# Patient Record
Sex: Female | Born: 1995 | Race: White | Hispanic: No | Marital: Single | Smoking: Never smoker
Health system: Southern US, Community
[De-identification: ages and names within clinical notes are randomized; demographics above are authoritative.]

## PROBLEM LIST (undated history)

## (undated) DIAGNOSIS — Z789 Other specified health status: Secondary | ICD-10-CM

## (undated) DIAGNOSIS — Z8489 Family history of other specified conditions: Secondary | ICD-10-CM

## (undated) HISTORY — DX: Other specified health status: Z78.9

## (undated) HISTORY — PX: NO PAST SURGERIES: SHX2092

## (undated) HISTORY — DX: Family history of other specified conditions: Z84.89

## (undated) HISTORY — PX: WISDOM TOOTH EXTRACTION: SHX21

---
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
## 2016-06-12 DIAGNOSIS — Z1389 Encounter for screening for other disorder: Secondary | ICD-10-CM | POA: Diagnosis not present

## 2016-06-12 DIAGNOSIS — Z308 Encounter for other contraceptive management: Secondary | ICD-10-CM | POA: Diagnosis not present

## 2016-06-12 DIAGNOSIS — Z68.41 Body mass index (BMI) pediatric, less than 5th percentile for age: Secondary | ICD-10-CM | POA: Diagnosis not present

## 2017-01-18 DIAGNOSIS — R6889 Other general symptoms and signs: Secondary | ICD-10-CM | POA: Diagnosis not present

## 2017-01-18 DIAGNOSIS — Z68.41 Body mass index (BMI) pediatric, less than 5th percentile for age: Secondary | ICD-10-CM | POA: Diagnosis not present

## 2017-01-18 DIAGNOSIS — G43109 Migraine with aura, not intractable, without status migrainosus: Secondary | ICD-10-CM | POA: Diagnosis not present

## 2017-01-18 DIAGNOSIS — Z1389 Encounter for screening for other disorder: Secondary | ICD-10-CM | POA: Diagnosis not present

## 2017-05-10 DIAGNOSIS — Z0001 Encounter for general adult medical examination with abnormal findings: Secondary | ICD-10-CM | POA: Diagnosis not present

## 2017-05-10 DIAGNOSIS — Z68.41 Body mass index (BMI) pediatric, less than 5th percentile for age: Secondary | ICD-10-CM | POA: Diagnosis not present

## 2017-05-10 DIAGNOSIS — Z1389 Encounter for screening for other disorder: Secondary | ICD-10-CM | POA: Diagnosis not present

## 2017-12-13 DIAGNOSIS — Z68.41 Body mass index (BMI) pediatric, less than 5th percentile for age: Secondary | ICD-10-CM | POA: Diagnosis not present

## 2017-12-13 DIAGNOSIS — Z0000 Encounter for general adult medical examination without abnormal findings: Secondary | ICD-10-CM | POA: Diagnosis not present

## 2017-12-13 DIAGNOSIS — N926 Irregular menstruation, unspecified: Secondary | ICD-10-CM | POA: Diagnosis not present

## 2017-12-13 DIAGNOSIS — Z1389 Encounter for screening for other disorder: Secondary | ICD-10-CM | POA: Diagnosis not present

## 2017-12-13 DIAGNOSIS — N946 Dysmenorrhea, unspecified: Secondary | ICD-10-CM | POA: Diagnosis not present

## 2017-12-26 DIAGNOSIS — M25532 Pain in left wrist: Secondary | ICD-10-CM | POA: Diagnosis not present

## 2017-12-26 DIAGNOSIS — M79642 Pain in left hand: Secondary | ICD-10-CM | POA: Diagnosis not present

## 2018-12-09 DIAGNOSIS — Z1389 Encounter for screening for other disorder: Secondary | ICD-10-CM | POA: Diagnosis not present

## 2018-12-09 DIAGNOSIS — Z0001 Encounter for general adult medical examination with abnormal findings: Secondary | ICD-10-CM | POA: Diagnosis not present

## 2018-12-09 DIAGNOSIS — F909 Attention-deficit hyperactivity disorder, unspecified type: Secondary | ICD-10-CM | POA: Diagnosis not present

## 2018-12-09 DIAGNOSIS — R0683 Snoring: Secondary | ICD-10-CM | POA: Diagnosis not present

## 2018-12-09 DIAGNOSIS — Z6841 Body Mass Index (BMI) 40.0 and over, adult: Secondary | ICD-10-CM | POA: Diagnosis not present

## 2019-01-27 ENCOUNTER — Other Ambulatory Visit (HOSPITAL_COMMUNITY)
Admission: RE | Admit: 2019-01-27 | Discharge: 2019-01-27 | Disposition: A | Discharge status: Home / Self Care | Payer: BLUE CROSS/BLUE SHIELD | Source: Ambulatory Visit | Attending: Adult Health | Admitting: Adult Health

## 2019-01-27 ENCOUNTER — Ambulatory Visit: Payer: BLUE CROSS/BLUE SHIELD | Admitting: Adult Health

## 2019-01-27 ENCOUNTER — Encounter: Payer: Self-pay | Admitting: Adult Health

## 2019-01-27 VITALS — BP 139/79 | HR 65 | Ht 60.0 in | Wt 224.5 lb

## 2019-01-27 DIAGNOSIS — Z124 Encounter for screening for malignant neoplasm of cervix: Secondary | ICD-10-CM | POA: Diagnosis not present

## 2019-01-27 DIAGNOSIS — Z01419 Encounter for gynecological examination (general) (routine) without abnormal findings: Secondary | ICD-10-CM | POA: Diagnosis not present

## 2019-01-27 DIAGNOSIS — N946 Dysmenorrhea, unspecified: Secondary | ICD-10-CM | POA: Diagnosis not present

## 2019-01-27 DIAGNOSIS — Z30011 Encounter for initial prescription of contraceptive pills: Secondary | ICD-10-CM | POA: Insufficient documentation

## 2019-01-27 MED ORDER — NORETHINDRONE ACET-ETHINYL EST 1-20 MG-MCG PO TABS: 1.0000 | ORAL_TABLET | Freq: Every day | ORAL | 11 refills | Status: DC

## 2019-01-27 NOTE — Progress Notes (Signed)
Patient ID: Christine Barajas, female   DOB: 1996-01-23, 23 y.o.   MRN: [id number] History of Present Illness:  Christine Barajas is a 23 year old white female, single, G0P0, in with her grandma for her first pap.She had physical with PCP. PCP is Dr Phillips Odor.  Current Medications, Allergies, Past Medical History, Past Surgical History, Family History and Social History were reviewed in [employer] record.     Review of Systems:  Patient denies any headaches, hearing loss, fatigue, blurred vision, shortness of breath, chest pain, abdominal pain, problems with bowel movements, urination, or intercourse. No joint pain or mood swings. +period cramps   Physical Exam:BP 139/79 (BP Location: Left Arm, Patient Position: Sitting, Cuff Size: Large)   Pulse 65   Ht 5' (1.524 m)   Wt 224 lb 8 oz (101.8 kg)   LMP 01/06/2019 (Approximate)   BMI 43.84 kg/m  General:  Well developed, well nourished, no acute distress Skin:  Warm and dry Pelvic:  External genitalia is normal in appearance, no lesions.  The vagina is normal in appearance. Urethra has no lesions or masses. The cervix is nulliparous, pap with GC/CHL and reflex HPV performed  Uterus is felt to be normal size, shape, and contour.  No adnexal masses or tenderness noted.Bladder is non tender, no masses felt. Extremities/musculoskeletal:  No swelling or varicosities noted, no clubbing or cyanosis Psych:  No mood changes, alert and cooperative,seems happy Fall risk is low. PHQ 2 score 0. Examination chaperoned by Francene Finders RN.   Impression: 1. Encounter for gynecological examination with Papanicolaou smear of cervix   2. Encounter for initial prescription of contraceptive pills   3. Menstrual cramps       Plan: Doreatha Martin current pill pack then start junel 1-20,use condoms for 1 pack Meds ordered this encounter  Medications  . norethindrone-ethinyl estradiol (MICROGESTIN,JUNEL,LOESTRIN) 1-20 MG-MCG tablet    Sig: Take 1  tablet by mouth daily.    Dispense:  1 Package    Refill:  11    Order Specific Question:   Supervising Provider    Answer:   Lazaro Arms [[id number]]   Physical with PCP Pap in 3 years if normal  Call prn problems

## 2019-01-28 LAB — CYTOLOGY - PAP
Adequacy: ABSENT
CHLAMYDIA, DNA PROBE: NEGATIVE
DIAGNOSIS: NEGATIVE
NEISSERIA GONORRHEA: NEGATIVE

## 2019-03-28 DIAGNOSIS — H60502 Unspecified acute noninfective otitis externa, left ear: Secondary | ICD-10-CM | POA: Diagnosis not present

## 2019-06-16 DIAGNOSIS — Z681 Body mass index (BMI) 19 or less, adult: Secondary | ICD-10-CM | POA: Diagnosis not present

## 2019-06-16 DIAGNOSIS — E669 Obesity, unspecified: Secondary | ICD-10-CM | POA: Diagnosis not present

## 2019-06-16 DIAGNOSIS — Z1389 Encounter for screening for other disorder: Secondary | ICD-10-CM | POA: Diagnosis not present

## 2019-06-16 DIAGNOSIS — J329 Chronic sinusitis, unspecified: Secondary | ICD-10-CM | POA: Diagnosis not present

## 2019-06-23 ENCOUNTER — Other Ambulatory Visit: Payer: BLUE CROSS/BLUE SHIELD

## 2019-06-23 DIAGNOSIS — Z20822 Contact with and (suspected) exposure to covid-19: Secondary | ICD-10-CM

## 2019-06-23 DIAGNOSIS — R6889 Other general symptoms and signs: Secondary | ICD-10-CM | POA: Diagnosis not present

## 2019-06-27 LAB — NOVEL CORONAVIRUS, NAA: SARS-CoV-2, NAA: NOT DETECTED

## 2020-02-27 DIAGNOSIS — U071 COVID-19: Secondary | ICD-10-CM | POA: Diagnosis not present

## 2020-03-15 ENCOUNTER — Telehealth: Payer: Self-pay | Admitting: Obstetrics & Gynecology

## 2020-03-15 NOTE — Telephone Encounter (Signed)

## 2020-03-17 ENCOUNTER — Other Ambulatory Visit: Payer: Self-pay

## 2020-03-17 ENCOUNTER — Ambulatory Visit (INDEPENDENT_AMBULATORY_CARE_PROVIDER_SITE_OTHER): Payer: BLUE CROSS/BLUE SHIELD

## 2020-03-17 VITALS — BP 116/67 | HR 84 | Ht 60.0 in | Wt 222.0 lb

## 2020-03-17 DIAGNOSIS — Z3201 Encounter for pregnancy test, result positive: Secondary | ICD-10-CM | POA: Diagnosis not present

## 2020-03-17 LAB — POCT URINE PREGNANCY: Preg Test, Ur: POSITIVE — AB

## 2020-03-17 MED ORDER — PROMETHAZINE HCL 25 MG PO TABS: 25.0000 mg | ORAL_TABLET | Freq: Four times a day (QID) | ORAL | 1 refills | Status: DC | PRN

## 2020-03-17 NOTE — Progress Notes (Signed)
   NURSE VISIT- PREGNANCY CONFIRMATION   SUBJECTIVE Christine Barajas is a 24 y.o. G0P0000 female . Here for pregnancy confirmation.  Home pregnancy test + . She reports nausea, also taking prenatal vitamins .    Marland Kitchen    OBJECTIVE:    Appears well, in no apparent distress OB History  Gravida Para Term Preterm AB Living  0 0 0 0 0 0  SAB TAB Ectopic Multiple Live Births  0 0 0 0 0      ASSESSMENT: + pregnancy test    having some nausea PLAN: Schedule for dating ultrasound 1 week  Taking prenatal vitamins   Nausea medicines need    OB packet given: Yes Will route note jennifer griffin  Derl Barrow Xochilt Conant  03/17/2020 2:45 PM

## 2020-03-17 NOTE — Addendum Note (Signed)
Addended by: Cyril Mourning A on: 03/17/2020 05:06 PM   Modules accepted: Orders

## 2020-03-17 NOTE — Progress Notes (Signed)
Chart reviewed for nurse visit. Agree with plan of care. Rx phenergan Adline Potter, NP 03/17/2020 5:05 PM

## 2020-03-18 ENCOUNTER — Telehealth: Payer: Self-pay | Admitting: Obstetrics & Gynecology

## 2020-03-18 NOTE — Telephone Encounter (Signed)
Patient states that she would like a note stating that she has a limit of what she could life up for her Job. Patient also states that she called off of work on  Friday and Sunday due to Morning Sickness and she would like a note to take for work stating that she was off because of her morning sickness. Please contact pt when ready.

## 2020-03-19 ENCOUNTER — Encounter: Payer: Self-pay | Admitting: *Deleted

## 2020-03-19 ENCOUNTER — Other Ambulatory Visit: Payer: Self-pay | Admitting: Obstetrics & Gynecology

## 2020-03-19 DIAGNOSIS — O3680X0 Pregnancy with inconclusive fetal viability, not applicable or unspecified: Secondary | ICD-10-CM

## 2020-03-22 ENCOUNTER — Other Ambulatory Visit: Payer: Self-pay

## 2020-03-22 ENCOUNTER — Ambulatory Visit (INDEPENDENT_AMBULATORY_CARE_PROVIDER_SITE_OTHER): Payer: BLUE CROSS/BLUE SHIELD

## 2020-03-22 ENCOUNTER — Encounter: Payer: Self-pay | Admitting: *Deleted

## 2020-03-22 DIAGNOSIS — O3680X0 Pregnancy with inconclusive fetal viability, not applicable or unspecified: Secondary | ICD-10-CM

## 2020-03-22 DIAGNOSIS — Z3A08 8 weeks gestation of pregnancy: Secondary | ICD-10-CM

## 2020-03-22 NOTE — Progress Notes (Signed)
[place] 8 wks,single IUP with ys,positive fht 162 bpm,normal ovaries,crl 17.87 mm

## 2020-04-16 ENCOUNTER — Other Ambulatory Visit: Payer: Self-pay | Admitting: Obstetrics and Gynecology

## 2020-04-16 DIAGNOSIS — Z3682 Encounter for antenatal screening for nuchal translucency: Secondary | ICD-10-CM

## 2020-04-19 ENCOUNTER — Ambulatory Visit (INDEPENDENT_AMBULATORY_CARE_PROVIDER_SITE_OTHER): Payer: BLUE CROSS/BLUE SHIELD

## 2020-04-19 ENCOUNTER — Encounter: Payer: Self-pay | Admitting: Advanced Practice Midwife

## 2020-04-19 ENCOUNTER — Ambulatory Visit: Payer: BC Managed Care – PPO | Admitting: *Deleted

## 2020-04-19 ENCOUNTER — Ambulatory Visit (INDEPENDENT_AMBULATORY_CARE_PROVIDER_SITE_OTHER): Payer: BC Managed Care – PPO | Admitting: Advanced Practice Midwife

## 2020-04-19 ENCOUNTER — Encounter: Payer: Self-pay | Admitting: Women's Health

## 2020-04-19 VITALS — BP 114/70 | HR 80 | Wt 219.0 lb

## 2020-04-19 DIAGNOSIS — Z3400 Encounter for supervision of normal first pregnancy, unspecified trimester: Secondary | ICD-10-CM | POA: Insufficient documentation

## 2020-04-19 DIAGNOSIS — Z3401 Encounter for supervision of normal first pregnancy, first trimester: Secondary | ICD-10-CM | POA: Diagnosis not present

## 2020-04-19 DIAGNOSIS — Z3A12 12 weeks gestation of pregnancy: Secondary | ICD-10-CM

## 2020-04-19 DIAGNOSIS — Z3682 Encounter for antenatal screening for nuchal translucency: Secondary | ICD-10-CM | POA: Diagnosis not present

## 2020-04-19 DIAGNOSIS — Z3402 Encounter for supervision of normal first pregnancy, second trimester: Secondary | ICD-10-CM

## 2020-04-19 DIAGNOSIS — Z6841 Body Mass Index (BMI) 40.0 and over, adult: Secondary | ICD-10-CM

## 2020-04-19 LAB — POCT URINALYSIS DIPSTICK OB
Blood, UA: NEGATIVE
Glucose, UA: NEGATIVE
Leukocytes, UA: NEGATIVE
Nitrite, UA: NEGATIVE
POC,PROTEIN,UA: NEGATIVE

## 2020-04-19 NOTE — Patient Instructions (Signed)
[name], I greatly value your feedback.  If you receive a survey following your visit with [place] today, we appreciate you taking the time to fill it out.  Thanks, Cathie Beams, DNP, CNM  [facility] HAS MOVED!!! It is now [facility] at [facility] ([address] [place], [place] [address]) Entrance located off of [address] Free 24/7 valet parking   Nausea & Vomiting  Have saltine crackers or pretzels by your bed and eat a few bites before you raise your head out of bed in the morning  Eat small frequent meals throughout the day instead of large meals  Drink plenty of fluids throughout the day to stay hydrated, just don't drink a lot of fluids with your meals.  This can make your stomach fill up faster making you feel sick  Do not brush your teeth right after you eat  Products with real ginger are good for nausea, like ginger ale and ginger hard candy Make sure it says made with real ginger!  Sucking on sour candy like lemon heads is also good for nausea  If your prenatal vitamins make you nauseated, take them at night so you will sleep through the nausea  Sea Bands  If you feel like you need medicine for the nausea & vomiting please let us know  If you are unable to keep any fluids or food down please let us know   Constipation  Drink plenty of fluid, preferably water, throughout the day  Eat foods high in fiber such as fruits, vegetables, and grains  Exercise, such as walking, is a good way to keep your bowels regular  Drink warm fluids, especially warm prune juice, or decaf coffee  Eat a 1/2 cup of real oatmeal (not instant), 1/2 cup applesauce, and 1/2-1 cup warm prune juice every day  If needed, you may take Colace (docusate sodium) stool softener once or twice a day to help keep the stool soft.   If you still are having problems with constipation, you may take Miralax once daily as needed to help keep your bowels regular.    Home Blood Pressure Monitoring for Patients   Your provider has recommended that you check your blood pressure (BP) at least once a week at home. If you do not have a blood pressure cuff at home, one will be provided for you. Contact your provider if you have not received your monitor within 1 week.   Helpful Tips for Accurate Home Blood Pressure Checks  . Don't smoke, exercise, or drink caffeine 30 minutes before checking your BP . Use the restroom before checking your BP (a full bladder can raise your pressure) . Relax in a comfortable upright chair . Feet on the ground . Left arm resting comfortably on a flat surface at the level of your heart . Legs uncrossed . Back supported . Sit quietly and don't talk . Place the cuff on your bare arm . Adjust snuggly, so that only two fingertips can fit between your skin and the top of the cuff . Check 2 readings separated by at least one minute . Keep a log of your BP readings . For a visual, please reference this diagram: http://ccnc.care/bpdiagram  Provider Name: [facility]     Phone: [phone]  Zone 1: ALL CLEAR  Continue to monitor your symptoms:  . BP reading is less than 140 (top number) or less than 90 (bottom number)  . No right upper stomach  pain . No headaches or seeing spots . No feeling nauseated or throwing up . No swelling in face and hands  Zone 2: CAUTION Call your doctor's office for any of the following:  . BP reading is greater than 140 (top number) or greater than 90 (bottom number)  . Stomach pain under your ribs in the middle or right side . Headaches or seeing spots . Feeling nauseated or throwing up . Swelling in face and hands  Zone 3: EMERGENCY  Seek immediate medical care if you have any of the following:  . BP reading is greater than160 (top number) or greater than 110 (bottom number) . Severe headaches not improving with Tylenol . Serious difficulty catching your breath . Any worsening  symptoms from Zone 2    First Trimester of Pregnancy The first trimester of pregnancy is from week 1 until the end of week 12 (months 1 through 3). A week after a sperm fertilizes an egg, the egg will implant on the wall of the uterus. This embryo will begin to develop into a baby. Genes from you and your partner are forming the baby. The female genes determine whether the baby is a boy or a girl. At 6-8 weeks, the eyes and face are formed, and the heartbeat can be seen on ultrasound. At the end of 12 weeks, all the baby's organs are formed.  Now that you are pregnant, you will want to do everything you can to have a healthy baby. Two of the most important things are to get good prenatal care and to follow your health care provider's instructions. Prenatal care is all the medical care you receive before the baby's birth. This care will help prevent, find, and treat any problems during the pregnancy and childbirth. BODY CHANGES Your body goes through many changes during pregnancy. The changes vary from woman to woman.   You may gain or lose a couple of pounds at first.  You may feel sick to your stomach (nauseous) and throw up (vomit). If the vomiting is uncontrollable, call your health care provider.  You may tire easily.  You may develop headaches that can be relieved by medicines approved by your health care provider.  You may urinate more often. Painful urination may mean you have a bladder infection.  You may develop heartburn as a result of your pregnancy.  You may develop constipation because certain hormones are causing the muscles that push waste through your intestines to slow down.  You may develop hemorrhoids or swollen, bulging veins (varicose veins).  Your breasts may begin to grow larger and become tender. Your nipples may stick out more, and the tissue that surrounds them (areola) may become darker.  Your gums may bleed and may be sensitive to brushing and flossing.  Dark  spots or blotches (chloasma, mask of pregnancy) may develop on your face. This will likely fade after the baby is born.  Your menstrual periods will stop.  You may have a loss of appetite.  You may develop cravings for certain kinds of food.  You may have changes in your emotions from day to day, such as being excited to be pregnant or being concerned that something may go wrong with the pregnancy and baby.  You may have more vivid and strange dreams.  You may have changes in your hair. These can include thickening of your hair, rapid growth, and changes in texture. Some women also have hair loss during or after pregnancy, or hair that  feels dry or thin. Your hair will most likely return to normal after your baby is born. WHAT TO EXPECT AT YOUR PRENATAL VISITS During a routine prenatal visit:  You will be weighed to make sure you and the baby are growing normally.  Your blood pressure will be taken.  Your abdomen will be measured to track your baby's growth.  The fetal heartbeat will be listened to starting around week 10 or 12 of your pregnancy.  Test results from any previous visits will be discussed. Your health care provider may ask you:  How you are feeling.  If you are feeling the baby move.  If you have had any abnormal symptoms, such as leaking fluid, bleeding, severe headaches, or abdominal cramping.  If you have any questions. Other tests that may be performed during your first trimester include:  Blood tests to find your blood type and to check for the presence of any previous infections. They will also be used to check for low iron levels (anemia) and Rh antibodies. Later in the pregnancy, blood tests for diabetes will be done along with other tests if problems develop.  Urine tests to check for infections, diabetes, or protein in the urine.  An ultrasound to confirm the proper growth and development of the baby.  An amniocentesis to check for possible genetic  problems.  Fetal screens for spina bifida and Down syndrome.  You may need other tests to make sure you and the baby are doing well. HOME CARE INSTRUCTIONS  Medicines  Follow your health care provider's instructions regarding medicine use. Specific medicines may be either safe or unsafe to take during pregnancy.  Take your prenatal vitamins as directed.  If you develop constipation, try taking a stool softener if your health care provider approves. Diet  Eat regular, well-balanced meals. Choose a variety of foods, such as meat or vegetable-based protein, fish, milk and low-fat dairy products, vegetables, fruits, and whole grain breads and cereals. Your health care provider will help you determine the amount of weight gain that is right for you.  Avoid raw meat and uncooked cheese. These carry germs that can cause birth defects in the baby.  Eating four or five small meals rather than three large meals a day may help relieve nausea and vomiting. If you start to feel nauseous, eating a few soda crackers can be helpful. Drinking liquids between meals instead of during meals also seems to help nausea and vomiting.  If you develop constipation, eat more high-fiber foods, such as fresh vegetables or fruit and whole grains. Drink enough fluids to keep your urine clear or pale yellow. Activity and Exercise  Exercise only as directed by your health care provider. Exercising will help you:  Control your weight.  Stay in shape.  Be prepared for labor and delivery.  Experiencing pain or cramping in the lower abdomen or low back is a good sign that you should stop exercising. Check with your health care provider before continuing normal exercises.  Try to avoid standing for long periods of time. Move your legs often if you must stand in one place for a long time.  Avoid heavy lifting.  Wear low-heeled shoes, and practice good posture.  You may continue to have sex unless your health care  provider directs you otherwise. Relief of Pain or Discomfort  Wear a good support bra for breast tenderness.    Take warm sitz baths to soothe any pain or discomfort caused by hemorrhoids. Use hemorrhoid cream  if your health care provider approves.    Rest with your legs elevated if you have leg cramps or low back pain.  If you develop varicose veins in your legs, wear support hose. Elevate your feet for 15 minutes, 3-4 times a day. Limit salt in your diet. Prenatal Care  Schedule your prenatal visits by the twelfth week of pregnancy. They are usually scheduled monthly at first, then more often in the last 2 months before delivery.  Write down your questions. Take them to your prenatal visits.  Keep all your prenatal visits as directed by your health care provider. Safety  Wear your seat belt at all times when driving.  Make a list of emergency phone numbers, including numbers for family, friends, the hospital, and police and fire departments. General Tips  Ask your health care provider for a referral to a local prenatal education class. Begin classes no later than at the beginning of month 6 of your pregnancy.  Ask for help if you have counseling or nutritional needs during pregnancy. Your health care provider can offer advice or refer you to specialists for help with various needs.  Do not use hot tubs, steam rooms, or saunas.  Do not douche or use tampons or scented sanitary pads.  Do not cross your legs for long periods of time.  Avoid cat litter boxes and soil used by cats. These carry germs that can cause birth defects in the baby and possibly loss of the fetus by miscarriage or stillbirth.  Avoid all smoking, herbs, alcohol, and medicines not prescribed by your health care provider. Chemicals in these affect the formation and growth of the baby.  Schedule a dentist appointment. At home, brush your teeth with a soft toothbrush and be gentle when you floss. SEEK MEDICAL  CARE IF:   You have dizziness.  You have mild pelvic cramps, pelvic pressure, or nagging pain in the abdominal area.  You have persistent nausea, vomiting, or diarrhea.  You have a bad smelling vaginal discharge.  You have pain with urination.  You notice increased swelling in your face, hands, legs, or ankles. SEEK IMMEDIATE MEDICAL CARE IF:   You have a fever.  You are leaking fluid from your vagina.  You have spotting or bleeding from your vagina.  You have severe abdominal cramping or pain.  You have rapid weight gain or loss.  You vomit blood or material that looks like coffee grounds.  You are exposed to [place] measles and have never had them.  You are exposed to fifth disease or chickenpox.  You develop a severe headache.  You have shortness of breath.  You have any kind of trauma, such as from a fall or a car accident. Document Released: 11/14/2001 Document Revised: 04/06/2014 Document Reviewed: 09/30/2013 [facility] Patient Information 2015 [place], [place]. This information is not intended to replace advice given to you by your health care provider. Make sure you discuss any questions you have with your health care provider.  Coronavirus (COVID-19) Are you at risk?  Are you at risk for the Coronavirus (COVID-19)?  To be considered HIGH RISK for Coronavirus (COVID-19), you have to meet the following criteria:  . Traveled to [place], [place], [place], [place] or [place]; or in the [place] to [place], [place], [place], or [place]; and have fever, cough, and shortness of breath within the last 2 weeks of travel OR . Been in close contact with a person diagnosed with COVID-19 within the last 2  weeks and have fever, cough, and shortness of breath . IF YOU DO NOT MEET THESE CRITERIA, YOU ARE CONSIDERED LOW RISK FOR COVID-19.  What to do if you are HIGH RISK for COVID-19?  Marland Kitchen If you are having a medical emergency, call 911. . Seek medical care right away.  Before you go to a doctor's office, urgent care or emergency department, call ahead and tell them about your recent travel, contact with someone diagnosed with COVID-19, and your symptoms. You should receive instructions from your physician's office regarding next steps of care.  . When you arrive at healthcare provider, tell the healthcare staff immediately you have returned from visiting [place], [place], [place], [place] or [place]; or traveled in the [place] to [place], [place], [place], or [place]; in the last two weeks or you have been in close contact with a person diagnosed with COVID-19 in the last 2 weeks.   . Tell the health care staff about your symptoms: fever, cough and shortness of breath. . After you have been seen by a medical provider, you will be either: o Tested for (COVID-19) and discharged home on quarantine except to seek medical care if symptoms worsen, and asked to  - Stay home and avoid contact with others until you get your results (4-5 days)  - Avoid travel on public transportation if possible (such as bus, train, or airplane) or o Sent to the Emergency Department by EMS for evaluation, COVID-19 testing, and possible admission depending on your condition and test results.  What to do if you are LOW RISK for COVID-19?  Reduce your risk of any infection by using the same precautions used for avoiding the common cold or flu:  Marland Kitchen Wash your hands often with soap and warm water for at least 20 seconds.  If soap and water are not readily available, use an alcohol-based hand sanitizer with at least 60% alcohol.  . If coughing or sneezing, cover your mouth and nose by coughing or sneezing into the elbow areas of your shirt or coat, into a tissue or into your sleeve (not your hands). . Avoid shaking hands with others and consider head nods or verbal greetings only. . Avoid touching your eyes, nose, or mouth with unwashed hands.  . Avoid close contact with people who are  sick. . Avoid places or events with large numbers of people in one location, like concerts or sporting events. . Carefully consider travel plans you have or are making. . If you are planning any travel outside or inside the [place], visit the CDC's Travelers' Health webpage for the latest health notices. . If you have some symptoms but not all symptoms, continue to monitor at home and seek medical attention if your symptoms worsen. . If you are having a medical emergency, call 911.   [place] / e-Visit: [email or web address]         [facility]: [facility]: [phone]                   [facility]: [phone]     Safe Medications in Pregnancy   Acne: Benzoyl Peroxide Salicylic Acid  Backache/Headache: Tylenol: 2 regular strength every 4 hours OR              2 Extra strength every 6 hours  Colds/Coughs/Allergies: Benadryl (alcohol free) 25 mg every 6 hours as needed Breath right strips Claritin  Cepacol throat lozenges Chloraseptic throat spray Cold-Eeze- up to three times per day Cough drops, alcohol free Flonase (by prescription only) Guaifenesin Mucinex Robitussin DM (plain only, alcohol free) Saline nasal spray/drops Sudafed (pseudoephedrine) & Actifed ** use only after [redacted] weeks gestation and if you do not have high blood pressure Tylenol Vicks Vaporub Zinc lozenges Zyrtec   Constipation: Colace Ducolax suppositories Fleet enema Glycerin suppositories Metamucil Milk of magnesia Miralax Senokot Smooth move tea  Diarrhea: Kaopectate Imodium A-D  *NO pepto Bismol  Hemorrhoids: Anusol Anusol HC Preparation H Tucks  Indigestion: Tums Maalox Mylanta Zantac  Pepcid  Insomnia: Benadryl (alcohol free) 69m every 6 hours as needed Tylenol PM Unisom, no Gelcaps  Leg  Cramps: Tums MagGel  Nausea/Vomiting:  Bonine Dramamine Emetrol Ginger extract Sea bands Meclizine  Nausea medication to take during pregnancy:  Unisom (doxylamine succinate 25 mg tablets) Take one tablet daily at bedtime. If symptoms are not adequately controlled, the dose can be increased to a maximum recommended dose of two tablets daily (1/2 tablet in the morning, 1/2 tablet mid-afternoon and one at bedtime). Vitamin B6 1035mtablets. Take one tablet twice a day (up to 200 mg per day).  Skin Rashes: Aveeno products Benadryl cream or 2582mvery 6 hours as needed Calamine Lotion 1% cortisone cream  Yeast infection: Gyne-lotrimin 7 Monistat 7   **If taking multiple medications, please check labels to avoid duplicating the same active ingredients **take medication as directed on the label ** Do not exceed 4000 mg of tylenol in 24 hours **Do not take medications that contain aspirin or ibuprofen

## 2020-04-19 NOTE — Progress Notes (Signed)
[place] 12 wks,measurements c/w dates,CRL 66.51 mm,NB present,NT 1.4 mm,normal ovaries,fhr 147 bpm

## 2020-04-19 NOTE — Progress Notes (Signed)
INITIAL OBSTETRICAL VISIT Patient name: Christine Barajas MRN [id number]  Date of birth: July 31, 1996 Chief Complaint:   Initial Prenatal Visit (nt/it)  History of Present Illness:   Christine Barajas is a 24 y.o. G12P0000 Caucasian female at [redacted]w[redacted]d by LMP/8 week [place] with an Estimated Date of Delivery: 11/01/20 being seen today for her initial obstetrical visit.   Her obstetrical history is significant for first baby.   Today she reports no complaints  Nausea is improving. .  Patient's last menstrual period was 01/26/2020. Last pap 01/27/19. Results were: normal Review of Systems:   Pertinent items are noted in HPI Denies cramping/contractions, leakage of fluid, vaginal bleeding, abnormal vaginal discharge w/ itching/odor/irritation, headaches, visual changes, shortness of breath, chest pain, abdominal pain, severe nausea/vomiting, or problems with urination or bowel movements unless otherwise stated above.  Pertinent History Reviewed:  Reviewed past medical,surgical, social, obstetrical and family history.  Reviewed problem list, medications and allergies. OB History  Gravida Para Term Preterm AB Living  1 0 0 0 0 0  SAB TAB Ectopic Multiple Live Births  0 0 0 0 0    # Outcome Date GA Lbr Len/2nd Weight Sex Delivery Anes PTL Lv  1 Current            Physical Assessment:   Vitals:   04/19/20 1524  BP: 114/70  Pulse: 80  Weight: 219 lb (99.3 kg)  Body mass index is 42.77 kg/m.       Physical Examination:  General appearance - well appearing, and in no distress  Mental status - alert, oriented to person, place, and time  Psych:  She has a normal mood and affect  Skin - warm and dry, normal color, no suspicious lesions noted  Chest - effort normal, all lung fields clear to auscultation bilaterally  Heart - normal rate and regular rhythm  Abdomen - soft, nontender  Extremities:  No swelling or varicosities noted   Fetal Heart Rate (bpm): [place] via [place] 12 wks,measurements c/w dates,CRL  66.51 mm,NB present,NT 1.4 mm,normal ovaries,fhr 147 bpm  No results found for this or any previous visit (from the past 24 hour(s)).  Assessment & Plan:  1) Low-Risk Pregnancy G1P0000 at [redacted]w[redacted]d with an Estimated Date of Delivery: 11/01/20   2) Initial OB visit  3) Covid 2 months ago--hold off on vaccine. If she wants  Vaccine later, check antibodies first.   4)  [employer] is offering her 60% pay since she can't lift/push/pull >20 lbs. Will let us know if she decides to take it.   Meds: No orders of the defined types were placed in this encounter.   Initial labs obtained Continue prenatal vitamins Reviewed n/v relief measures and warning s/s to report Reviewed recommended weight gain based on pre-gravid BMI Encouraged well-balanced diet Watched video for carrier screening/genetic testing:  Genetic Screening discussed First Screen and Integrated Screen: requested Cystic fibrosis screening requested SMA screening requested Fragile X screening requested Ultrasound discussed; fetal survey: requested CCNC completed  Follow-up: Return in about 3 weeks (around 05/10/2020) for OB Mychart visit.   Orders Placed This Encounter  Procedures  . Urine Culture  . GC/Chlamydia Probe Amp  . Integrated 1  . Genetic Screening  . Obstetric Panel, Including HIV  . Hepatitis C antibody  . Hemoglobin A1c  . POC Urinalysis Dipstick OB    Jacklyn Shell DNP, CNM 04/19/2020 4:21 PM

## 2020-04-20 LAB — GC/CHLAMYDIA PROBE AMP
Chlamydia trachomatis, NAA: NEGATIVE
Neisseria Gonorrhoeae by PCR: NEGATIVE

## 2020-04-21 LAB — HEMOGLOBIN A1C
Est. average glucose Bld gHb Est-mCnc: 100 mg/dL
Hgb A1c MFr Bld: 5.1 % (ref 4.8–5.6)

## 2020-04-21 LAB — OBSTETRIC PANEL, INCLUDING HIV
Antibody Screen: NEGATIVE
Basophils Absolute: 0 10*3/uL (ref 0.0–0.2)
Basos: 0 %
EOS (ABSOLUTE): 0.2 10*3/uL (ref 0.0–0.4)
Eos: 1 %
HIV Screen 4th Generation wRfx: NONREACTIVE
Hematocrit: 38.8 % (ref 34.0–46.6)
Hemoglobin: 13.5 g/dL (ref 11.1–15.9)
Hepatitis B Surface Ag: NEGATIVE
Immature Grans (Abs): 0 10*3/uL (ref 0.0–0.1)
Immature Granulocytes: 0 %
Lymphocytes Absolute: 2.4 10*3/uL (ref 0.7–3.1)
Lymphs: 22 %
MCH: 29.5 pg (ref 26.6–33.0)
MCHC: 34.8 g/dL (ref 31.5–35.7)
MCV: 85 fL (ref 79–97)
Monocytes Absolute: 0.5 10*3/uL (ref 0.1–0.9)
Monocytes: 5 %
Neutrophils Absolute: 7.6 10*3/uL — ABNORMAL HIGH (ref 1.4–7.0)
Neutrophils: 72 %
Platelets: 183 10*3/uL (ref 150–450)
RBC: 4.57 x10E6/uL (ref 3.77–5.28)
RDW: 13.1 % (ref 11.7–15.4)
RPR Ser Ql: NONREACTIVE
Rh Factor: POSITIVE
Rubella Antibodies, IGG: 1.92 index (ref >0.99)
WBC: 10.7 10*3/uL (ref 3.4–10.8)

## 2020-04-21 LAB — HEPATITIS C ANTIBODY: Hep C Virus Ab: <0.1 s/co ratio (ref 0.0–0.9)

## 2020-04-21 LAB — URINE CULTURE: Organism ID, Bacteria: NO GROWTH

## 2020-04-21 LAB — INTEGRATED 1
Crown Rump Length: 66.5 mm
Gest. Age on Collection Date: 12.7 weeks
Maternal Age at EDD: 23.8 yr
Nuchal Translucency (NT): 1.4 mm
Number of Fetuses: 1
PAPP-A Value: 833.8 ng/mL
Weight: 219 [lb_av]

## 2020-05-10 ENCOUNTER — Telehealth (INDEPENDENT_AMBULATORY_CARE_PROVIDER_SITE_OTHER): Payer: BLUE CROSS/BLUE SHIELD | Admitting: Women's Health

## 2020-05-10 ENCOUNTER — Encounter: Payer: Self-pay | Admitting: Women's Health

## 2020-05-10 VITALS — BP 126/76 | HR 90

## 2020-05-10 DIAGNOSIS — Z3A15 15 weeks gestation of pregnancy: Secondary | ICD-10-CM

## 2020-05-10 DIAGNOSIS — Z363 Encounter for antenatal screening for malformations: Secondary | ICD-10-CM

## 2020-05-10 DIAGNOSIS — Z3402 Encounter for supervision of normal first pregnancy, second trimester: Secondary | ICD-10-CM

## 2020-05-10 NOTE — Progress Notes (Signed)
TELEHEALTH VIRTUAL OBSTETRICS VISIT ENCOUNTER NOTE Patient name: Christine Barajas MRN [id number]  Date of birth: 20-Jan-1996  I connected with patient on 05/10/20 at  4:10 PM EDT by MyChart video and verified that I am speaking with the correct person using two identifiers. Pt is not currently in our office, she is at home.  The provider is in the office.    I discussed the limitations, risks, security and privacy concerns of performing an evaluation and management service by telephone and the availability of in person appointments. I also discussed with the patient that there may be a patient responsible charge related to this service. The patient expressed understanding and agreed to proceed.  Chief Complaint:   Routine Prenatal Visit  History of Present Illness:   Christine Barajas is a 24 y.o. G1P0000 female at [redacted]w[redacted]d with an Estimated Date of Delivery: 11/01/20 being evaluated today for ongoing management of a low-risk pregnancy.  Depression screen [facility] 2/9 04/19/2020 01/27/2019  Decreased Interest 1 0  Down, Depressed, Hopeless 1 0  PHQ - 2 Score 2 0  Altered sleeping 1 -  Tired, decreased energy 1 -  Change in appetite 1 -  Feeling bad or failure about yourself  0 -  Trouble concentrating 0 -  Moving slowly or fidgety/restless 1 -  Suicidal thoughts 0 -  PHQ-9 Score 6 -  Difficult doing work/chores Somewhat difficult -    Today she reports headache that lasted 4 days. Does have h/o migraines dx at 24yo. Contractions: Not present. Vag. Bleeding: None.   . denies leaking of fluid. Review of Systems:   Pertinent items are noted in HPI Denies abnormal vaginal discharge w/ itching/odor/irritation, headaches, visual changes, shortness of breath, chest pain, abdominal pain, severe nausea/vomiting, or problems with urination or bowel movements unless otherwise stated above. Pertinent History Reviewed:  Reviewed past medical,surgical, social, obstetrical and family history.  Reviewed  problem list, medications and allergies. Physical Assessment:   Vitals:   05/10/20 1613  BP: 126/76  Pulse: 90  There is no height or weight on file to calculate BMI.        Physical Examination:   General:  Alert, oriented and cooperative.   Mental Status: Normal mood and affect perceived. Normal judgment and thought content.  Rest of physical exam deferred due to type of encounter  No results found for this or any previous visit (from the past 24 hour(s)).  Assessment & Plan:  1) Pregnancy G1P0000 at [redacted]w[redacted]d with an Estimated Date of Delivery: 11/01/20   2) Headaches, h/o migraines, gave printed prevention/relief measures    Meds: No orders of the defined types were placed in this encounter.   Labs/procedures today: none  Plan:  Continue routine obstetrical care.  Has home bp cuff.  Check bp weekly, let us know if >140/90.  Next visit: prefers will be in person for anatomy u/s, 2nd IT    Reviewed: Preterm labor symptoms and general obstetric precautions including but not limited to vaginal bleeding, contractions, leaking of fluid and fetal movement were reviewed in detail with the patient. The patient was advised to call back or seek an in-person office evaluation/go to [facility]U at [facility] for any urgent or concerning symptoms. All questions were answered. Please refer to After Visit Summary for other counseling recommendations.    I provided 15 minutes of non-face-to-face time during this encounter.  Follow-up: Return in about 3 weeks (around 05/31/2020) for LROB, [id number], 2nd IT, in person,  CNM.  Orders Placed This Encounter  Procedures  . US OB Comp + 14 Wk   Conception Doebler R Sandra Tellefsen CNM, [facility] 05/10/2020 4:29 PM

## 2020-05-10 NOTE — Patient Instructions (Addendum)
[name], I greatly value your feedback.  If you receive a survey following your visit with [place] today, we appreciate you taking the time to fill it out.  Thanks, Joellyn Haff, CNM, WHNP-BC  [facility] ([address] [place], [place] [address]) Entrance C, located off of [address] valet parking  Go to [email or web address] to register for FREE online childbirth classes  For Headaches:   Stay well hydrated, drink enough water so that your urine is clear, sometimes if you are dehydrated you can get headaches  Eat small frequent meals and snacks, sometimes if you are hungry you can get headaches  Sometimes you get headaches during pregnancy from the pregnancy hormones  You can try tylenol (1-2 regular strength 325mg  or 1-2 extra strength 500mg ) as directed on the box. The least amount of medication that works is best.   Cool compresses (cool wet washcloth or ice pack) to area of head that is hurting  You can also try drinking a caffeinated drink to see if this will help  If not helping, try below:  For Prevention of Headaches/Migraines:  CoQ10 100mg  three times daily  Vitamin B2 400mg  daily  Magnesium Oxide 400-600mg  daily  If You Get a Bad Headache/Migraine:  Benadryl 25mg    Magnesium Oxide  1 large Gatorade  2 extra strength Tylenol (1,000mg  total)  1 cup coffee or Coke  If this doesn't help please call @ [phone]    [facility] Pediatricians/Family Doctors:  Pediatrics [phone]            [facility] [phone]                 [facility] [phone] (usually not accepting new patients unless you have family there already, you are always welcome to call and ask)       [facility] Department [phone]       [facility] Pediatricians/Family Doctors:   [facility]: [phone]  [facility]: [phone]  [facility]: [phone]  [facility] Doctors:   [facility]: [phone]   [phone] Family Medicine: [phone]  [facility] Doctors:  [facility]: [phone]    Home Blood Pressure Monitoring for Patients   Your provider has recommended that you check your blood pressure (BP) at least once a week at home. If you do not have a blood pressure cuff at home, one will be provided for you. Contact your provider if you have not received your monitor within 1 week.   Helpful Tips for Accurate Home Blood Pressure Checks  . Don't smoke, exercise, or drink caffeine 30 minutes before checking your BP . Use the restroom before checking your BP (a full bladder can raise your pressure) . Relax in a comfortable upright chair . Feet on the ground . Left arm resting comfortably on a flat surface at the level of your heart . Legs uncrossed . Back supported . Sit quietly and don't talk . Place the cuff on your bare arm . Adjust snuggly, so that only two fingertips can fit between your skin and the top of the cuff . Check 2 readings separated by at least one minute . Keep a log of your BP readings . For a visual, please reference this diagram: http://ccnc.care/bpdiagram  Provider Name: [facility]     Phone: [phone]  Zone 1: ALL CLEAR  Continue to monitor your symptoms:  . BP  reading is less than 140 (top number) or less than 90 (bottom number)  . No right upper stomach pain . No headaches or seeing spots . No feeling nauseated or throwing up . No swelling in face and hands  Zone 2: CAUTION Call your doctor's office for any of the following:  . BP reading is greater than 140 (top number) or greater than 90 (bottom number)  . Stomach pain under your ribs in the middle or right side . Headaches or seeing spots . Feeling nauseated or throwing up . Swelling in face and hands  Zone 3: EMERGENCY  Seek immediate medical care if you  have any of the following:  . BP reading is greater than160 (top number) or greater than 110 (bottom number) . Severe headaches not improving with Tylenol . Serious difficulty catching your breath . Any worsening symptoms from Zone 2     Second Trimester of Pregnancy The second trimester is from week 14 through week 27 (months 4 through 6). The second trimester is often a time when you feel your best. Your body has adjusted to being pregnant, and you begin to feel better physically. Usually, morning sickness has lessened or quit completely, you may have more energy, and you may have an increase in appetite. The second trimester is also a time when the fetus is growing rapidly. At the end of the sixth month, the fetus is about 9 inches long and weighs about 1 pounds. You will likely begin to feel the baby move (quickening) between 16 and 20 weeks of pregnancy. Body changes during your second trimester Your body continues to go through many changes during your second trimester. The changes vary from woman to woman.  Your weight will continue to increase. You will notice your lower abdomen bulging out.  You may begin to get stretch marks on your hips, abdomen, and breasts.  You may develop headaches that can be relieved by medicines. The medicines should be approved by your health care provider.  You may urinate more often because the fetus is pressing on your bladder.  You may develop or continue to have heartburn as a result of your pregnancy.  You may develop constipation because certain hormones are causing the muscles that push waste through your intestines to slow down.  You may develop hemorrhoids or swollen, bulging veins (varicose veins).  You may have back pain. This is caused by: ? Weight gain. ? Pregnancy hormones that are relaxing the joints in your pelvis. ? A shift in weight and the muscles that support your balance.  Your breasts will continue to grow and they will  continue to become tender.  Your gums may bleed and may be sensitive to brushing and flossing.  Dark spots or blotches (chloasma, mask of pregnancy) may develop on your face. This will likely fade after the baby is born.  A dark line from your belly button to the pubic area (linea nigra) may appear. This will likely fade after the baby is born.  You may have changes in your hair. These can include thickening of your hair, rapid growth, and changes in texture. Some women also have hair loss during or after pregnancy, or hair that feels dry or thin. Your hair will most likely return to normal after your baby is born.  What to expect at prenatal visits During a routine prenatal visit:  You will be weighed to make sure you and the fetus are growing normally.  Your blood pressure will  be taken.  Your abdomen will be measured to track your baby's growth.  The fetal heartbeat will be listened to.  Any test results from the previous visit will be discussed.  Your health care provider may ask you:  How you are feeling.  If you are feeling the baby move.  If you have had any abnormal symptoms, such as leaking fluid, bleeding, severe headaches, or abdominal cramping.  If you are using any tobacco products, including cigarettes, chewing tobacco, and electronic cigarettes.  If you have any questions.  Other tests that may be performed during your second trimester include:  Blood tests that check for: ? Low iron levels (anemia). ? High blood sugar that affects pregnant women (gestational diabetes) between 30 and 28 weeks. ? Rh antibodies. This is to check for a protein on red blood cells (Rh factor).  Urine tests to check for infections, diabetes, or protein in the urine.  An ultrasound to confirm the proper growth and development of the baby.  An amniocentesis to check for possible genetic problems.  Fetal screens for spina bifida and Down syndrome.  HIV (human immunodeficiency  virus) testing. Routine prenatal testing includes screening for HIV, unless you choose not to have this test.  Follow these instructions at home: Medicines  Follow your health care provider's instructions regarding medicine use. Specific medicines may be either safe or unsafe to take during pregnancy.  Take a prenatal vitamin that contains at least 600 micrograms (mcg) of folic acid.  If you develop constipation, try taking a stool softener if your health care provider approves. Eating and drinking  Eat a balanced diet that includes fresh fruits and vegetables, whole grains, good sources of protein such as meat, eggs, or tofu, and low-fat dairy. Your health care provider will help you determine the amount of weight gain that is right for you.  Avoid raw meat and uncooked cheese. These carry germs that can cause birth defects in the baby.  If you have low calcium intake from food, talk to your health care provider about whether you should take a daily calcium supplement.  Limit foods that are high in fat and processed sugars, such as fried and sweet foods.  To prevent constipation: ? Drink enough fluid to keep your urine clear or pale yellow. ? Eat foods that are high in fiber, such as fresh fruits and vegetables, whole grains, and beans. Activity  Exercise only as directed by your health care provider. Most women can continue their usual exercise routine during pregnancy. Try to exercise for 30 minutes at least 5 days a week. Stop exercising if you experience uterine contractions.  Avoid heavy lifting, wear low heel shoes, and practice good posture.  A sexual relationship may be continued unless your health care provider directs you otherwise. Relieving pain and discomfort  Wear a good support bra to prevent discomfort from breast tenderness.  Take warm sitz baths to soothe any pain or discomfort caused by hemorrhoids. Use hemorrhoid cream if your health care provider  approves.  Rest with your legs elevated if you have leg cramps or low back pain.  If you develop varicose veins, wear support hose. Elevate your feet for 15 minutes, 3-4 times a day. Limit salt in your diet. Prenatal Care  Write down your questions. Take them to your prenatal visits.  Keep all your prenatal visits as told by your health care provider. This is important. Safety  Wear your seat belt at all times when driving.  Make  a list of emergency phone numbers, including numbers for family, friends, the hospital, and police and fire departments. General instructions  Ask your health care provider for a referral to a local prenatal education class. Begin classes no later than the beginning of month 6 of your pregnancy.  Ask for help if you have counseling or nutritional needs during pregnancy. Your health care provider can offer advice or refer you to specialists for help with various needs.  Do not use hot tubs, steam rooms, or saunas.  Do not douche or use tampons or scented sanitary pads.  Do not cross your legs for long periods of time.  Avoid cat litter boxes and soil used by cats. These carry germs that can cause birth defects in the baby and possibly loss of the fetus by miscarriage or stillbirth.  Avoid all smoking, herbs, alcohol, and unprescribed drugs. Chemicals in these products can affect the formation and growth of the baby.  Do not use any products that contain nicotine or tobacco, such as cigarettes and e-cigarettes. If you need help quitting, ask your health care provider.  Visit your dentist if you have not gone yet during your pregnancy. Use a soft toothbrush to brush your teeth and be gentle when you floss. Contact a health care provider if:  You have dizziness.  You have mild pelvic cramps, pelvic pressure, or nagging pain in the abdominal area.  You have persistent nausea, vomiting, or diarrhea.  You have a bad smelling vaginal discharge.  You have  pain when you urinate. Get help right away if:  You have a fever.  You are leaking fluid from your vagina.  You have spotting or bleeding from your vagina.  You have severe abdominal cramping or pain.  You have rapid weight gain or weight loss.  You have shortness of breath with chest pain.  You notice sudden or extreme swelling of your face, hands, ankles, feet, or legs.  You have not felt your baby move in over an hour.  You have severe headaches that do not go away when you take medicine.  You have vision changes. Summary  The second trimester is from week 14 through week 27 (months 4 through 6). It is also a time when the fetus is growing rapidly.  Your body goes through many changes during pregnancy. The changes vary from woman to woman.  Avoid all smoking, herbs, alcohol, and unprescribed drugs. These chemicals affect the formation and growth your baby.  Do not use any tobacco products, such as cigarettes, chewing tobacco, and e-cigarettes. If you need help quitting, ask your health care provider.  Contact your health care provider if you have any questions. Keep all prenatal visits as told by your health care provider. This is important. This information is not intended to replace advice given to you by your health care provider. Make sure you discuss any questions you have with your health care provider. Document Released: 11/14/2001 Document Revised: 04/27/2016 Document Reviewed: 01/21/2013 Elsevier Interactive Patient Education  2017 [employer] Inc.  PROTECT YOURSELF & YOUR BABY FROM THE FLU! Because you are pregnant, we at [facility], along with the Centers for Disease Control (CDC), recommend that you receive the flu vaccine to protect yourself and your baby from the flu. The flu is more likely to cause severe illness in pregnant women than in women of reproductive age who are not pregnant. Changes in the immune system, heart, and lungs during pregnancy make pregnant  women (and women up  to two weeks postpartum) more prone to severe illness from flu, including illness resulting in hospitalization. Flu also may be harmful for a pregnant woman's developing baby. A common flu symptom is fever, which may be associated with neural tube defects and other adverse outcomes for a developing baby. Getting vaccinated can also help protect a baby after birth from flu. (Mom passes antibodies onto the developing baby during her pregnancy.)  A Flu Vaccine is the Best Protection Against Flu Getting a flu vaccine is the first and most important step in protecting against flu. Pregnant women should get a flu shot and not the live attenuated influenza vaccine (LAIV), also known as nasal spray flu vaccine. Flu vaccines given during pregnancy help protect both the mother and her baby from flu. Vaccination has been shown to reduce the risk of flu-associated acute respiratory infection in pregnant women by up to one-half. A 2018 study showed that getting a flu shot reduced a pregnant woman's risk of being hospitalized with flu by an average of 40 percent. Pregnant women who get a flu vaccine are also helping to protect their babies from flu illness for the first several months after their birth, when they are too young to get vaccinated.   A Long Record of Safety for Flu Shots in Pregnant Women Flu shots have been given to millions of pregnant women over many years with a good safety record. There is a lot of evidence that flu vaccines can be given safely during pregnancy; though these data are limited for the first trimester. The CDC recommends that pregnant women get vaccinated during any trimester of their pregnancy. It is very important for pregnant women to get the flu shot.   Other Preventive Actions In addition to getting a flu shot, pregnant women should take the same everyday preventive actions the CDC recommends of everyone, including covering coughs, washing hands often, and  avoiding people who are sick.  Symptoms and Treatment If you get sick with flu symptoms call your doctor right away. There are antiviral drugs that can treat flu illness and prevent serious flu complications. The CDC recommends prompt treatment for people who have influenza infection or suspected influenza infection and who are at high risk of serious flu complications, such as people with asthma, diabetes (including gestational diabetes), or heart disease. Early treatment of influenza in hospitalized pregnant women has been shown to reduce the length of the hospital stay.  Symptoms Flu symptoms include fever, cough, sore throat, runny or stuffy nose, body aches, headache, chills and fatigue. Some people may also have vomiting and diarrhea. People may be infected with the flu and have respiratory symptoms without a fever.  Early Treatment is Important for Pregnant Women Treatment should begin as soon as possible because antiviral drugs work best when started early (within 48 hours after symptoms start). Antiviral drugs can make your flu illness milder and make you feel better faster. They may also prevent serious health problems that can result from flu illness. Oral oseltamivir (Tamiflu) is the preferred treatment for pregnant women because it has the most studies available to suggest that it is safe and beneficial. Antiviral drugs require a prescription from your provider. Having a fever caused by flu infection or other infections early in pregnancy may be linked to birth defects in a baby. In addition to taking antiviral drugs, pregnant women who get a fever should treat their fever with Tylenol (acetaminophen) and contact their provider immediately.  When to [place]  If you are pregnant and have any of these signs, seek care immediately:  Difficulty breathing or shortness of breath  Pain or pressure in the chest or abdomen  Sudden dizziness  Confusion  Severe or  persistent vomiting  High fever that is not responding to Tylenol (or store brand equivalent)  Decreased or no movement of your baby  [email or web address]

## 2020-05-31 ENCOUNTER — Encounter: Payer: BLUE CROSS/BLUE SHIELD | Admitting: Obstetrics & Gynecology

## 2020-05-31 ENCOUNTER — Other Ambulatory Visit: Payer: BLUE CROSS/BLUE SHIELD

## 2020-06-02 ENCOUNTER — Ambulatory Visit (INDEPENDENT_AMBULATORY_CARE_PROVIDER_SITE_OTHER): Payer: BLUE CROSS/BLUE SHIELD | Admitting: Obstetrics and Gynecology

## 2020-06-02 ENCOUNTER — Ambulatory Visit (INDEPENDENT_AMBULATORY_CARE_PROVIDER_SITE_OTHER): Payer: BC Managed Care – PPO

## 2020-06-02 VITALS — BP 129/88 | HR 91 | Wt 223.0 lb

## 2020-06-02 DIAGNOSIS — Z331 Pregnant state, incidental: Secondary | ICD-10-CM

## 2020-06-02 DIAGNOSIS — Z1379 Encounter for other screening for genetic and chromosomal anomalies: Secondary | ICD-10-CM

## 2020-06-02 DIAGNOSIS — Z3A18 18 weeks gestation of pregnancy: Secondary | ICD-10-CM | POA: Diagnosis not present

## 2020-06-02 DIAGNOSIS — Z3402 Encounter for supervision of normal first pregnancy, second trimester: Secondary | ICD-10-CM

## 2020-06-02 DIAGNOSIS — Z363 Encounter for antenatal screening for malformations: Secondary | ICD-10-CM

## 2020-06-02 DIAGNOSIS — Z1389 Encounter for screening for other disorder: Secondary | ICD-10-CM

## 2020-06-02 LAB — POCT URINALYSIS DIPSTICK OB
Blood, UA: NEGATIVE
Glucose, UA: NEGATIVE
Ketones, UA: NEGATIVE
Leukocytes, UA: NEGATIVE
Nitrite, UA: NEGATIVE
POC,PROTEIN,UA: NEGATIVE

## 2020-06-02 NOTE — Progress Notes (Signed)
PATIENT ID: Christine Barajas, female     DOB: Oct 21, 1996, 24 y.o.     MRN: [id number]    LOW-RISK PREGNANCY VISIT PATIENT NAME: Christine Barajas MRN [id number]  DOB: January 31, 1996  Chief Complaint:   Routine Prenatal Visit (Ultrasound)  History of Present Illness:   Christine Barajas is a 24 y.o. G26P0000 female at [redacted]w[redacted]d with an Estimated Date of Delivery: 11/01/20 being seen today for ongoing management of a low-risk pregnancy.   Her nausea is resolved. She is up 2 lbs from the start of her pregnancy.   Depression screen [facility] 2/9 04/19/2020 01/27/2019  Decreased Interest 1 0  Down, Depressed, Hopeless 1 0  PHQ - 2 Score 2 0  Altered sleeping 1 -  Tired, decreased energy 1 -  Change in appetite 1 -  Feeling bad or failure about yourself  0 -  Trouble concentrating 0 -  Moving slowly or fidgety/restless 1 -  Suicidal thoughts 0 -  PHQ-9 Score 6 -  Difficult doing work/chores Somewhat difficult -    Today she reports no complaints. Contractions: Not present. Vag. Bleeding: None.  Movement: Present. denies leaking of fluid.  Review of Systems:   Pertinent items are noted in HPI Denies abnormal vaginal discharge w/ itching/odor/irritation, headaches, visual changes, shortness of breath, chest pain, abdominal pain, severe nausea/vomiting, or problems with urination or bowel movements unless otherwise stated above.  [place] 18+2 wks,anterior placenta gr 0,cx 4 cm,normal ovaries,svp of fluid 4.3 cm,fhr 164 bpm,EFW 268 g 83%,anatomy complete,no obvious abnormalities   Pertinent History Reviewed:  Reviewed past medical,surgical, social, obstetrical and family history.  Reviewed problem list, medications and allergies.  Physical Assessment:   Vitals:   06/02/20 1548  BP: 129/88  Pulse: 91  Weight: 223 lb (101.2 kg)  Body mass index is 43.55 kg/m.        Physical Examination:   General appearance: Well appearing, and in no distress  Mental status: Alert, oriented to person, place, and  time  Skin: Warm & dry  Cardiovascular: Normal heart rate noted  Respiratory: Normal respiratory effort, no distress  Abdomen: Soft, gravid, nontender  Pelvic: Cervical exam deferred         Extremities: Edema: None  Fetal Status:     Movement: Present   TECHNICIAN COMMENTS:  [place] 18+2 wks,anterior placenta gr 0,cx 4 cm,normal ovaries,svp of fluid 4.3 cm,fhr 164 bpm,EFW 268 g 83%,anatomy complete,no obvious abnormalities       A copy of this report including all images has been saved and backed up to a second source for retrieval if needed. All measures and details of the anatomical scan, placentation, fluid volume and pelvic anatomy are contained in that report.  Amber Flora Lipps 06/02/2020 3:48 PM Chaperone: Mal Misty    Results for orders placed or performed in visit on 06/02/20 (from the past 24 hour(s))  POC Urinalysis Dipstick OB   Collection Time: 06/02/20  3:49 PM  Result Value Ref Range   Color, UA     Clarity, UA     Glucose, UA Negative Negative   Bilirubin, UA     Ketones, UA n    Spec Grav, UA     Blood, UA n    pH, UA     POC,PROTEIN,UA Negative Negative, Trace, Small (1+), Moderate (2+), Large (3+), 4+   Urobilinogen, UA     Nitrite, UA n    Leukocytes, UA Negative Negative   Appearance     Odor  Assessment & Plan:  1) Low-risk pregnancy G1P0000 at [redacted]w[redacted]d with an Estimated Date of Delivery: 11/01/20      Meds: No orders of the defined types were placed in this encounter.   Labs/procedures today: u/s anatomy  Plan:  Continue routine obstetrical care  Next visit: prefers online    Reviewed: Term labor symptoms and general obstetric precautions including but not limited to vaginal bleeding, contractions, leaking of fluid and fetal movement were reviewed in detail with the patient.  All questions were answered. Check bp weekly, let us know if >140/90.   Follow-up: No follow-ups on file.  By signing my name below, I, [employer], attest that  this documentation has been prepared under the direction and in the presence of [email or web address] Electronically Signed: Advertising copywriter. 06/02/20. 3:57 PM.  I personally performed the services described in this documentation, which was SCRIBED in my presence. The recorded information has been reviewed and considered accurate. It has been edited as necessary during review. Tilda Burrow, MD

## 2020-06-02 NOTE — Progress Notes (Signed)
[place] 18+2 wks,anterior placenta gr 0,cx 4 cm,normal ovaries,svp of fluid 4.3 cm,fhr 164 bpm,EFW 268 g 83%,anatomy complete,no obvious abnormalities

## 2020-06-02 NOTE — Patient Instructions (Addendum)
   [place] [facility] [address] [place],  [place]  [address]  Free 24/7 valet parking  Get Driving Directions  Main: [phone]

## 2020-06-04 LAB — INTEGRATED 2
AFP MoM: 1.08
Alpha-Fetoprotein: 38.6 ng/mL
Crown Rump Length: 66.5 mm
DIA MoM: 0.64
DIA Value: 84.5 pg/mL
Estriol, Unconjugated: 3.15 ng/mL
Gest. Age on Collection Date: 12.7 weeks
Gestational Age: 19 weeks
Maternal Age at EDD: 23.8 yr
Nuchal Translucency (NT): 1.4 mm
Nuchal Translucency MoM: 0.94
Number of Fetuses: 1
PAPP-A MoM: 1.22
PAPP-A Value: 833.8 ng/mL
Test Results:: NEGATIVE
Weight: 219 [lb_av]
Weight: 219 [lb_av]
hCG MoM: 0.19
hCG Value: 3.4 IU/mL
uE3 MoM: 1.99

## 2020-06-22 ENCOUNTER — Encounter: Payer: Self-pay | Admitting: *Deleted

## 2020-06-30 ENCOUNTER — Telehealth: Payer: BLUE CROSS/BLUE SHIELD | Admitting: Advanced Practice Midwife

## 2020-07-07 ENCOUNTER — Telehealth (INDEPENDENT_AMBULATORY_CARE_PROVIDER_SITE_OTHER): Payer: BLUE CROSS/BLUE SHIELD | Admitting: Women's Health

## 2020-07-07 ENCOUNTER — Encounter: Payer: Self-pay | Admitting: Women's Health

## 2020-07-07 VITALS — BP 100/77 | HR 92

## 2020-07-07 DIAGNOSIS — Z3A23 23 weeks gestation of pregnancy: Secondary | ICD-10-CM

## 2020-07-07 DIAGNOSIS — Z3402 Encounter for supervision of normal first pregnancy, second trimester: Secondary | ICD-10-CM

## 2020-07-07 NOTE — Progress Notes (Signed)
   TELEHEALTH VIRTUAL OBSTETRICS VISIT ENCOUNTER NOTE Patient name: Christine Barajas MRN [id number]  Date of birth: [birth year]  I connected with patient on 07/07/20 at  4:10 PM EDT by MyChart video  and verified that I am speaking with the correct person using two identifiers. Pt is not currently in our office, she is at home.  The provider is in the office.    I discussed the limitations, risks, security and privacy concerns of performing an evaluation and management service by telephone and the availability of in person appointments. I also discussed with the patient that there may be a patient responsible charge related to this service. The patient expressed understanding and agreed to proceed.  Chief Complaint:   Routine Prenatal Visit  History of Present Illness:   Christine Barajas is a 24 y.o. G1P0000 female at [redacted]w[redacted]d with an Estimated Date of Delivery: 11/01/20 being evaluated today for ongoing management of a low-risk pregnancy.  Depression screen [facility] 2/9 04/19/2020 01/27/2019  Decreased Interest 1 0  Down, Depressed, Hopeless 1 0  PHQ - 2 Score 2 0  Altered sleeping 1 -  Tired, decreased energy 1 -  Change in appetite 1 -  Feeling bad or failure about yourself  0 -  Trouble concentrating 0 -  Moving slowly or fidgety/restless 1 -  Suicidal thoughts 0 -  PHQ-9 Score 6 -  Difficult doing work/chores Somewhat difficult -    Today she reports no complaints. Contractions: Not present. Vag. Bleeding: None.  Movement: Present. denies leaking of fluid. Review of Systems:   Pertinent items are noted in HPI Denies abnormal vaginal discharge w/ itching/odor/irritation, headaches, visual changes, shortness of breath, chest pain, abdominal pain, severe nausea/vomiting, or problems with urination or bowel movements unless otherwise stated above. Pertinent History Reviewed:  Reviewed past medical,surgical, social, obstetrical and family history.  Reviewed problem list, medications and  allergies. Physical Assessment:   Vitals:   07/07/20 1629  BP: 100/77  Pulse: 92  There is no height or weight on file to calculate BMI.        Physical Examination:   General:  Alert, oriented and cooperative.   Mental Status: Normal mood and affect perceived. Normal judgment and thought content.  Rest of physical exam deferred due to type of encounter  No results found for this or any previous visit (from the past 24 hour(s)).  Assessment & Plan:  1) Pregnancy G1P0000 at [redacted]w[redacted]d with an Estimated Date of Delivery: 11/01/20    Meds: No orders of the defined types were placed in this encounter.  Labs/procedures today: none  Plan:  Continue routine obstetrical care.  Has home bp cuff.  Check bp weekly, let us know if >140/90.  Next visit: prefers will be in person for n2    Reviewed: Preterm labor symptoms and general obstetric precautions including but not limited to vaginal bleeding, contractions, leaking of fluid and fetal movement were reviewed in detail with the patient. The patient was advised to call back or seek an in-person office evaluation/go to [facility] at [facility] for any urgent or concerning symptoms. All questions were answered. Please refer to After Visit Summary for other counseling recommendations.    I provided 15 minutes of non-face-to-face time during this encounter.  Follow-up: Return in about 4 weeks (around 08/04/2020) for LROB, PN2, in person, CNM.  No orders of the defined types were placed in this encounter.  Cheral Marker CNM, [facility] 07/07/2020 4:43 PM

## 2020-07-07 NOTE — Patient Instructions (Signed)
Christine Barajas, I greatly value your feedback.  If you receive a survey following your visit with [place] today, we appreciate you taking the time to fill it out.  Thanks, Joellyn Haff, CNM, WHNP-BC   You will have your sugar test next visit.  Please do not eat or drink anything after midnight the night before you come, not even water.  You will be here for at least two hours.  Please make an appointment online for the bloodwork at [email or web address] for 8:30am (or as close to this as possible). Make sure you select the [facility] service center. The day of the appointment, check in with our office first, then you will go to [facility] to start the sugar test.    [facility] [place], [place] [address]) Entrance C, located off of [address] valet parking  Go to [email or web address] to register for FREE online childbirth classes   Call the office [phone]) or go to [facility] if:  You begin to have strong, frequent contractions  Your water breaks.  Sometimes it is a big gush of fluid, sometimes it is just a trickle that keeps getting your panties wet or running down your legs  You have vaginal bleeding.  It is normal to have a small amount of spotting if your cervix was checked.   You don't feel your baby moving like normal.  If you don't, get you something to eat and drink and lay down and focus on feeling your baby move.   If your baby is still not moving like normal, you should call the office or go to [facility].  [place] Pediatricians/Family Doctors:  [facility] [phone]            [facility] [phone]                 [facility] [phone] (usually not accepting new patients unless you have family there already, you are always welcome to call and ask)       [facility] Department [phone]       [facility] Pediatricians/Family Doctors:   [facility]:  [phone]  [facility]: [phone]  [facility]: [phone]  [facility] Doctors:   [facility]: [phone]   [facility]: [phone]  [facility] Doctors:  [facility]: [phone]   Home Blood Pressure Monitoring for Patients   Your provider has recommended that you check your blood pressure (BP) at least once a week at home. If you do not have a blood pressure cuff at home, one will be provided for you. Contact your provider if you have not received your monitor within 1 week.   Helpful Tips for Accurate Home Blood Pressure Checks  . Don't smoke, exercise, or drink caffeine 30 minutes before checking your BP . Use the restroom before checking your BP (a full bladder can raise your pressure) . Relax in a comfortable upright chair . Feet on the ground . Left arm resting comfortably on a flat surface at the level of your heart . Legs uncrossed . Back supported . Sit quietly and don't talk . Place the cuff on your bare arm . Adjust snuggly, so that only two fingertips can fit between your skin and the top of the cuff . Check 2 readings separated by at least one minute . Keep a log of your BP readings . For a visual, please  reference this diagram: http://ccnc.care/bpdiagram  Provider Name: [facility]     Phone: [phone]  Zone 1: ALL CLEAR  Continue to monitor your symptoms:  . BP reading is less than 140 (top number) or less than 90 (bottom number)  . No right upper stomach pain . No headaches or seeing spots . No feeling nauseated or throwing up . No swelling in face and hands  Zone 2: CAUTION Call your doctor's office for any of the following:  . BP reading is greater than 140 (top number) or greater than 90 (bottom number)  . Stomach pain under your ribs in the middle or right side . Headaches or seeing spots . Feeling nauseated or throwing up . Swelling in  face and hands  Zone 3: EMERGENCY  Seek immediate medical care if you have any of the following:  . BP reading is greater than160 (top number) or greater than 110 (bottom number) . Severe headaches not improving with Tylenol . Serious difficulty catching your breath . Any worsening symptoms from Zone 2   Second Trimester of Pregnancy The second trimester is from week 13 through week 28, months 4 through 6. The second trimester is often a time when you feel your best. Your body has also adjusted to being pregnant, and you begin to feel better physically. Usually, morning sickness has lessened or quit completely, you may have more energy, and you may have an increase in appetite. The second trimester is also a time when the fetus is growing rapidly. At the end of the sixth month, the fetus is about 9 inches long and weighs about 1 pounds. You will likely begin to feel the baby move (quickening) between 18 and 20 weeks of the pregnancy. BODY CHANGES Your body goes through many changes during pregnancy. The changes vary from woman to woman.   Your weight will continue to increase. You will notice your lower abdomen bulging out.  You may begin to get stretch marks on your hips, abdomen, and breasts.  You may develop headaches that can be relieved by medicines approved by your health care provider.  You may urinate more often because the fetus is pressing on your bladder.  You may develop or continue to have heartburn as a result of your pregnancy.  You may develop constipation because certain hormones are causing the muscles that push waste through your intestines to slow down.  You may develop hemorrhoids or swollen, bulging veins (varicose veins).  You may have back pain because of the weight gain and pregnancy hormones relaxing your joints between the bones in your pelvis and as a result of a shift in weight and the muscles that support your balance.  Your breasts will continue to grow  and be tender.  Your gums may bleed and may be sensitive to brushing and flossing.  Dark spots or blotches (chloasma, mask of pregnancy) may develop on your face. This will likely fade after the baby is born.  A dark line from your belly button to the pubic area (linea nigra) may appear. This will likely fade after the baby is born.  You may have changes in your hair. These can include thickening of your hair, rapid growth, and changes in texture. Some women also have hair loss during or after pregnancy, or hair that feels dry or thin. Your hair will most likely return to normal after your baby is born. WHAT TO EXPECT AT YOUR PRENATAL VISITS During a routine prenatal visit:  You  will be weighed to make sure you and the fetus are growing normally.  Your blood pressure will be taken.  Your abdomen will be measured to track your baby's growth.  The fetal heartbeat will be listened to.  Any test results from the previous visit will be discussed. Your health care provider may ask you:  How you are feeling.  If you are feeling the baby move.  If you have had any abnormal symptoms, such as leaking fluid, bleeding, severe headaches, or abdominal cramping.  If you have any questions. Other tests that may be performed during your second trimester include:  Blood tests that check for:  Low iron levels (anemia).  Gestational diabetes (between 24 and 28 weeks).  Rh antibodies.  Urine tests to check for infections, diabetes, or protein in the urine.  An ultrasound to confirm the proper growth and development of the baby.  An amniocentesis to check for possible genetic problems.  Fetal screens for spina bifida and Down syndrome. HOME CARE INSTRUCTIONS   Avoid all smoking, herbs, alcohol, and unprescribed drugs. These chemicals affect the formation and growth of the baby.  Follow your health care provider's instructions regarding medicine use. There are medicines that are either  safe or unsafe to take during pregnancy.  Exercise only as directed by your health care provider. Experiencing uterine cramps is a good sign to stop exercising.  Continue to eat regular, healthy meals.  Wear a good support bra for breast tenderness.  Do not use hot tubs, steam rooms, or saunas.  Wear your seat belt at all times when driving.  Avoid raw meat, uncooked cheese, cat litter boxes, and soil used by cats. These carry germs that can cause birth defects in the baby.  Take your prenatal vitamins.  Try taking a stool softener (if your health care provider approves) if you develop constipation. Eat more high-fiber foods, such as fresh vegetables or fruit and whole grains. Drink plenty of fluids to keep your urine clear or pale yellow.  Take warm sitz baths to soothe any pain or discomfort caused by hemorrhoids. Use hemorrhoid cream if your health care provider approves.  If you develop varicose veins, wear support hose. Elevate your feet for 15 minutes, 3-4 times a day. Limit salt in your diet.  Avoid heavy lifting, wear low heel shoes, and practice good posture.  Rest with your legs elevated if you have leg cramps or low back pain.  Visit your dentist if you have not gone yet during your pregnancy. Use a soft toothbrush to brush your teeth and be gentle when you floss.  A sexual relationship may be continued unless your health care provider directs you otherwise.  Continue to go to all your prenatal visits as directed by your health care provider. SEEK MEDICAL CARE IF:   You have dizziness.  You have mild pelvic cramps, pelvic pressure, or nagging pain in the abdominal area.  You have persistent nausea, vomiting, or diarrhea.  You have a bad smelling vaginal discharge.  You have pain with urination. SEEK IMMEDIATE MEDICAL CARE IF:   You have a fever.  You are leaking fluid from your vagina.  You have spotting or bleeding from your vagina.  You have severe  abdominal cramping or pain.  You have rapid weight gain or loss.  You have shortness of breath with chest pain.  You notice sudden or extreme swelling of your face, hands, ankles, feet, or legs.  You have not felt your baby move  in over an hour.  You have severe headaches that do not go away with medicine.  You have vision changes. Document Released: 11/14/2001 Document Revised: 11/25/2013 Document Reviewed: 01/21/2013 [facility] Patient Information 2015 [place], [place]. This information is not intended to replace advice given to you by your health care provider. Make sure you discuss any questions you have with your health care provider.

## 2020-08-04 ENCOUNTER — Encounter: Payer: Self-pay | Admitting: Women's Health

## 2020-08-04 ENCOUNTER — Ambulatory Visit (INDEPENDENT_AMBULATORY_CARE_PROVIDER_SITE_OTHER): Payer: BLUE CROSS/BLUE SHIELD | Admitting: Women's Health

## 2020-08-04 ENCOUNTER — Other Ambulatory Visit: Payer: BLUE CROSS/BLUE SHIELD

## 2020-08-04 VITALS — BP 110/65 | HR 77 | Wt 226.0 lb

## 2020-08-04 DIAGNOSIS — Z131 Encounter for screening for diabetes mellitus: Secondary | ICD-10-CM

## 2020-08-04 DIAGNOSIS — Z331 Pregnant state, incidental: Secondary | ICD-10-CM | POA: Diagnosis not present

## 2020-08-04 DIAGNOSIS — Z3403 Encounter for supervision of normal first pregnancy, third trimester: Secondary | ICD-10-CM | POA: Diagnosis not present

## 2020-08-04 DIAGNOSIS — Z1389 Encounter for screening for other disorder: Secondary | ICD-10-CM | POA: Diagnosis not present

## 2020-08-04 DIAGNOSIS — Z3A27 27 weeks gestation of pregnancy: Secondary | ICD-10-CM

## 2020-08-04 DIAGNOSIS — Z3402 Encounter for supervision of normal first pregnancy, second trimester: Secondary | ICD-10-CM

## 2020-08-04 LAB — POCT URINALYSIS DIPSTICK OB
Blood, UA: NEGATIVE
Glucose, UA: NEGATIVE
Ketones, UA: NEGATIVE
Leukocytes, UA: NEGATIVE
Nitrite, UA: NEGATIVE
POC,PROTEIN,UA: NEGATIVE

## 2020-08-04 NOTE — Progress Notes (Signed)
LOW-RISK PREGNANCY VISIT Patient name: Christine Barajas MRN [id number]  Date of birth: 1996-04-09 Chief Complaint:   Routine Prenatal Visit (PN2)  History of Present Illness:   Christine Barajas is a 24 y.o. G20P0000 female at [redacted]w[redacted]d with an Estimated Date of Delivery: 11/01/20 being seen today for ongoing management of a low-risk pregnancy.  Depression screen [facility] 2/9 04/19/2020 01/27/2019  Decreased Interest 1 0  Down, Depressed, Hopeless 1 0  PHQ - 2 Score 2 0  Altered sleeping 1 -  Tired, decreased energy 1 -  Change in appetite 1 -  Feeling bad or failure about yourself  0 -  Trouble concentrating 0 -  Moving slowly or fidgety/restless 1 -  Suicidal thoughts 0 -  PHQ-9 Score 6 -  Difficult doing work/chores Somewhat difficult -    Today she reports knot Lt upper back x , no changes, no pain. Contractions: Not present. Vag. Bleeding: None.  Movement: Present. denies leaking of fluid. Review of Systems:   Pertinent items are noted in HPI Denies abnormal vaginal discharge w/ itching/odor/irritation, headaches, visual changes, shortness of breath, chest pain, abdominal pain, severe nausea/vomiting, or problems with urination or bowel movements unless otherwise stated above. Pertinent History Reviewed:  Reviewed past medical,surgical, social, obstetrical and family history.  Reviewed problem list, medications and allergies. Physical Assessment:   Vitals:   08/04/20 0848  BP: 110/65  Pulse: 77  Weight: 226 lb (102.5 kg)  Body mass index is 44.14 kg/m.        Physical Examination:   General appearance: Well appearing, and in no distress  Mental status: Alert, oriented to person, place, and time  Skin: Warm & dry, firm cyst under skin Lt upper back, not visible, nontender  Cardiovascular: Normal heart rate noted  Respiratory: Normal respiratory effort, no distress  Abdomen: Soft, gravid, nontender  Pelvic: Cervical exam deferred         Extremities: Edema: None  Fetal  Status: Fetal Heart Rate (bpm): 147 Fundal Height: 29 cm Movement: Present    Chaperone: n/a    Results for orders placed or performed in visit on 08/04/20 (from the past 24 hour(s))  POC Urinalysis Dipstick OB   Collection Time: 08/04/20  8:50 AM  Result Value Ref Range   Color, UA     Clarity, UA     Glucose, UA Negative Negative   Bilirubin, UA     Ketones, UA neg    Spec Grav, UA     Blood, UA neg    pH, UA     POC,PROTEIN,UA Negative Negative, Trace, Small (1+), Moderate (2+), Large (3+), 4+   Urobilinogen, UA     Nitrite, UA neg    Leukocytes, UA Negative Negative   Appearance     Odor      Assessment & Plan:  1) Low-risk pregnancy G1P0000 at [redacted]w[redacted]d with an Estimated Date of Delivery: 11/01/20   2) ?Lipoma/cyst Lt upper back, x47mths, non-tender, no changes, pt to monitor and let us know if any changes   Meds: No orders of the defined types were placed in this encounter.  Labs/procedures today: pn2, wants tdap next in person visit  Plan:  Continue routine obstetrical care  Next visit: prefers online    Reviewed: Preterm labor symptoms and general obstetric precautions including but not limited to vaginal bleeding, contractions, leaking of fluid and fetal movement were reviewed in detail with the patient.  All questions were answered. Has home bp cuff.  Check  bp weekly, let us know if >140/90.   Follow-up: Return in about 3 weeks (around 08/25/2020) for LROB, CNM, MyChart Video.  Orders Placed This Encounter  Procedures  . POC Urinalysis Dipstick OB   Cheral Marker CNM, [facility] 08/04/2020 9:17 AM

## 2020-08-04 NOTE — Patient Instructions (Signed)
Christine Barajas, I greatly value your feedback.  If you receive a survey following your visit with [place] today, we appreciate you taking the time to fill it out.  Thanks, Joellyn Haff, CNM, WHNP-BC   [facility] ([address] [place], [place] [address]) Entrance C, located off of [address] valet parking  Go to [email or web address] to register for FREE online childbirth classes   Call the office [phone]) or go to [facility] if:  You begin to have strong, frequent contractions  Your water breaks.  Sometimes it is a big gush of fluid, sometimes it is just a trickle that keeps getting your panties wet or running down your legs  You have vaginal bleeding.  It is normal to have a small amount of spotting if your cervix was checked.   You don't feel your baby moving like normal.  If you don't, get you something to eat and drink and lay down and focus on feeling your baby move.  You should feel at least 10 movements in 2 hours.  If you don't, you should call the office or go to [facility].    Tdap Vaccine  It is recommended that you get the Tdap vaccine during the third trimester of EACH pregnancy to help protect your baby from getting pertussis (whooping cough)  27-36 weeks is the BEST time to do this so that you can pass the protection on to your baby. During pregnancy is better than after pregnancy, but if you are unable to get it during pregnancy it will be offered at the hospital.   You can get this vaccine with [place], at the health department, your family doctor, or some local pharmacies  Everyone who will be around your baby should also be up-to-date on their vaccines before the baby comes. Adults (who are not pregnant) only need 1 dose of Tdap during adulthood.   [place] Pediatricians/Family Doctors:  [facility] [phone]            [facility] [phone]                 [facility]  [phone] (usually not accepting new patients unless you have family there already, you are always welcome to call and ask)       [facility] Department [phone]       [facility] Pediatricians/Family Doctors:   [facility]: [phone]  [facility]: [phone]  [facility]: [phone]  [facility] Doctors:   [facility]: [phone]   [facility]: [phone]  [facility] Doctors:  [facility]: [phone]   Home Blood Pressure Monitoring for Patients   Your provider has recommended that you check your blood pressure (BP) at least once a week at home. If you do not have a blood pressure cuff at home, one will be provided for you. Contact your provider if you have not received your monitor within 1 week.   Helpful Tips for Accurate Home Blood Pressure Checks  . Don't smoke, exercise, or drink caffeine 30 minutes before checking your BP . Use the restroom before checking your BP (a full bladder can raise your pressure) . Relax in a comfortable upright chair . Feet on the ground . Left arm resting comfortably on a flat surface at the level of your heart . Legs uncrossed . Back supported . Sit quietly and don't talk . Place the cuff on your  bare arm . Adjust snuggly, so that only two fingertips can fit between your skin and the top of the cuff . Check 2 readings separated by at least one minute . Keep a log of your BP readings . For a visual, please reference this diagram: http://ccnc.care/bpdiagram  Provider Name: [facility]     Phone: [phone]  Zone 1: ALL CLEAR  Continue to monitor your symptoms:  . BP reading is less than 140 (top number) or less than 90 (bottom number)  . No right upper stomach pain . No headaches or seeing spots . No feeling nauseated or throwing up . No swelling in face and hands  Zone 2: CAUTION Call your  doctor's office for any of the following:  . BP reading is greater than 140 (top number) or greater than 90 (bottom number)  . Stomach pain under your ribs in the middle or right side . Headaches or seeing spots . Feeling nauseated or throwing up . Swelling in face and hands  Zone 3: EMERGENCY  Seek immediate medical care if you have any of the following:  . BP reading is greater than160 (top number) or greater than 110 (bottom number) . Severe headaches not improving with Tylenol . Serious difficulty catching your breath . Any worsening symptoms from Zone 2   Third Trimester of Pregnancy The third trimester is from week 29 through week 42, months 7 through 9. The third trimester is a time when the fetus is growing rapidly. At the end of the ninth month, the fetus is about 20 inches in length and weighs 6-10 pounds.  BODY CHANGES Your body goes through many changes during pregnancy. The changes vary from woman to woman.   Your weight will continue to increase. You can expect to gain 25-35 pounds (11-16 kg) by the end of the pregnancy.  You may begin to get stretch marks on your hips, abdomen, and breasts.  You may urinate more often because the fetus is moving lower into your pelvis and pressing on your bladder.  You may develop or continue to have heartburn as a result of your pregnancy.  You may develop constipation because certain hormones are causing the muscles that push waste through your intestines to slow down.  You may develop hemorrhoids or swollen, bulging veins (varicose veins).  You may have pelvic pain because of the weight gain and pregnancy hormones relaxing your joints between the bones in your pelvis. Backaches may result from overexertion of the muscles supporting your posture.  You may have changes in your hair. These can include thickening of your hair, rapid growth, and changes in texture. Some women also have hair loss during or after pregnancy, or hair that  feels dry or thin. Your hair will most likely return to normal after your baby is born.  Your breasts will continue to grow and be tender. A yellow discharge may leak from your breasts called colostrum.  Your belly button may stick out.  You may feel short of breath because of your expanding uterus.  You may notice the fetus "dropping," or moving lower in your abdomen.  You may have a bloody mucus discharge. This usually occurs a few days to a week before labor begins.  Your cervix becomes thin and soft (effaced) near your due date. WHAT TO EXPECT AT YOUR PRENATAL EXAMS  You will have prenatal exams every 2 weeks until week 36. Then, you will have weekly prenatal exams. During a routine prenatal visit:  You will be weighed to make sure you and the fetus are growing normally.  Your blood pressure is taken.  Your abdomen will be measured to track your baby's growth.  The fetal heartbeat will be listened to.  Any test results from the previous visit will be discussed.  You may have a cervical check near your due date to see if you have effaced. At around 36 weeks, your caregiver will check your cervix. At the same time, your caregiver will also perform a test on the secretions of the vaginal tissue. This test is to determine if a type of bacteria, Group B streptococcus, is present. Your caregiver will explain this further. Your caregiver may ask you:  What your birth plan is.  How you are feeling.  If you are feeling the baby move.  If you have had any abnormal symptoms, such as leaking fluid, bleeding, severe headaches, or abdominal cramping.  If you have any questions. Other tests or screenings that may be performed during your third trimester include:  Blood tests that check for low iron levels (anemia).  Fetal testing to check the health, activity level, and growth of the fetus. Testing is done if you have certain medical conditions or if there are problems during the  pregnancy. FALSE LABOR You may feel small, irregular contractions that eventually go away. These are called Braxton Hicks contractions, or false labor. Contractions may last for hours, days, or even weeks before true labor sets in. If contractions come at regular intervals, intensify, or become painful, it is best to be seen by your caregiver.  SIGNS OF LABOR   Menstrual-like cramps.  Contractions that are 5 minutes apart or less.  Contractions that start on the top of the uterus and spread down to the lower abdomen and back.  A sense of increased pelvic pressure or back pain.  A watery or bloody mucus discharge that comes from the vagina. If you have any of these signs before the 37th week of pregnancy, call your caregiver right away. You need to go to the hospital to get checked immediately. HOME CARE INSTRUCTIONS   Avoid all smoking, herbs, alcohol, and unprescribed drugs. These chemicals affect the formation and growth of the baby.  Follow your caregiver's instructions regarding medicine use. There are medicines that are either safe or unsafe to take during pregnancy.  Exercise only as directed by your caregiver. Experiencing uterine cramps is a good sign to stop exercising.  Continue to eat regular, healthy meals.  Wear a good support bra for breast tenderness.  Do not use hot tubs, steam rooms, or saunas.  Wear your seat belt at all times when driving.  Avoid raw meat, uncooked cheese, cat litter boxes, and soil used by cats. These carry germs that can cause birth defects in the baby.  Take your prenatal vitamins.  Try taking a stool softener (if your caregiver approves) if you develop constipation. Eat more high-fiber foods, such as fresh vegetables or fruit and whole grains. Drink plenty of fluids to keep your urine clear or pale yellow.  Take warm sitz baths to soothe any pain or discomfort caused by hemorrhoids. Use hemorrhoid cream if your caregiver approves.  If you  develop varicose veins, wear support hose. Elevate your feet for 15 minutes, 3-4 times a day. Limit salt in your diet.  Avoid heavy lifting, wear low heal shoes, and practice good posture.  Rest a lot with your legs elevated if you have leg cramps or low  back pain.  Visit your dentist if you have not gone during your pregnancy. Use a soft toothbrush to brush your teeth and be gentle when you floss.  A sexual relationship may be continued unless your caregiver directs you otherwise.  Do not travel far distances unless it is absolutely necessary and only with the approval of your caregiver.  Take prenatal classes to understand, practice, and ask questions about the labor and delivery.  Make a trial run to the hospital.  Pack your hospital bag.  Prepare the baby's nursery.  Continue to go to all your prenatal visits as directed by your caregiver. SEEK MEDICAL CARE IF:  You are unsure if you are in labor or if your water has broken.  You have dizziness.  You have mild pelvic cramps, pelvic pressure, or nagging pain in your abdominal area.  You have persistent nausea, vomiting, or diarrhea.  You have a bad smelling vaginal discharge.  You have pain with urination. SEEK IMMEDIATE MEDICAL CARE IF:   You have a fever.  You are leaking fluid from your vagina.  You have spotting or bleeding from your vagina.  You have severe abdominal cramping or pain.  You have rapid weight loss or gain.  You have shortness of breath with chest pain.  You notice sudden or extreme swelling of your face, hands, ankles, feet, or legs.  You have not felt your baby move in over an hour.  You have severe headaches that do not go away with medicine.  You have vision changes. Document Released: 11/14/2001 Document Revised: 11/25/2013 Document Reviewed: 01/21/2013 [facility] Patient Information 2015 [place], [place]. This information is not intended to replace advice given to you by your health  care provider. Make sure you discuss any questions you have with your health care provider.

## 2020-08-05 LAB — CBC
Hematocrit: 37.3 % (ref 34.0–46.6)
Hemoglobin: 12.3 g/dL (ref 11.1–15.9)
MCH: 28.7 pg (ref 26.6–33.0)
MCHC: 33 g/dL (ref 31.5–35.7)
MCV: 87 fL (ref 79–97)
Platelets: 160 10*3/uL (ref 150–450)
RBC: 4.29 x10E6/uL (ref 3.77–5.28)
RDW: 12.7 % (ref 11.7–15.4)
WBC: 8.6 10*3/uL (ref 3.4–10.8)

## 2020-08-05 LAB — GLUCOSE TOLERANCE, 2 HOURS W/ 1HR
Glucose, 1 hour: 135 mg/dL (ref 65–179)
Glucose, 2 hour: 98 mg/dL (ref 65–152)
Glucose, Fasting: 79 mg/dL (ref 65–91)

## 2020-08-05 LAB — HIV ANTIBODY (ROUTINE TESTING W REFLEX): HIV Screen 4th Generation wRfx: NONREACTIVE

## 2020-08-05 LAB — ANTIBODY SCREEN: Antibody Screen: NEGATIVE

## 2020-08-05 LAB — RPR: RPR Ser Ql: NONREACTIVE

## 2020-08-25 ENCOUNTER — Telehealth (INDEPENDENT_AMBULATORY_CARE_PROVIDER_SITE_OTHER): Payer: BLUE CROSS/BLUE SHIELD | Admitting: Advanced Practice Midwife

## 2020-08-25 ENCOUNTER — Encounter: Payer: Self-pay | Admitting: Advanced Practice Midwife

## 2020-08-25 VITALS — BP 105/62 | HR 80

## 2020-08-25 DIAGNOSIS — Z3403 Encounter for supervision of normal first pregnancy, third trimester: Secondary | ICD-10-CM

## 2020-08-25 DIAGNOSIS — Z3A30 30 weeks gestation of pregnancy: Secondary | ICD-10-CM

## 2020-08-25 NOTE — Progress Notes (Signed)
TELEHEALTH VIRTUAL OBSTETRICS VISIT ENCOUNTER NOTE Patient name: Christine Barajas MRN [id number]  Date of birth: 1996/04/23  I connected with patient on 08/25/20 at 11:50 AM EDT by MyChart and verified that I am speaking with the correct person using two identifiers. Due to COVID-19 recommendations, pt is not currently in our office; she is at home and I am in the office.   I discussed the limitations, risks, security and privacy concerns of performing an evaluation and management service by telephone and the availability of in person appointments. I also discussed with the patient that there may be a patient responsible charge related to this service. The patient expressed understanding and agreed to proceed.  Chief Complaint:   Routine Prenatal Visit  History of Present Illness:   Christine Barajas is a 24 y.o. G1P0000 female at [redacted]w[redacted]d with an Estimated Date of Delivery: 11/01/20 being evaluated today for ongoing management of a low-risk pregnancy.  Depression screen [facility] 2/9 04/19/2020 01/27/2019  Decreased Interest 1 0  Down, Depressed, Hopeless 1 0  PHQ - 2 Score 2 0  Altered sleeping 1 -  Tired, decreased energy 1 -  Change in appetite 1 -  Feeling bad or failure about yourself  0 -  Trouble concentrating 0 -  Moving slowly or fidgety/restless 1 -  Suicidal thoughts 0 -  PHQ-9 Score 6 -  Difficult doing work/chores Somewhat difficult -    Today she reports vomiting/abd cramping a few hours after eating a cheese-steak sandwich the other night, better now. Contractions: Not present. Vag. Bleeding: None.  Movement: Present. denies leaking of fluid. Review of Systems:   Pertinent items are noted in HPI Denies abnormal vaginal discharge w/ itching/odor/irritation, headaches, visual changes, shortness of breath, chest pain, abdominal pain, severe nausea/vomiting, or problems with urination or bowel movements unless otherwise stated above. Pertinent History Reviewed:  Reviewed past  medical,surgical, social, obstetrical and family history.  Reviewed problem list, medications and allergies. Physical Assessment:   Vitals:   08/25/20 1132  BP: 105/62  Pulse: 80  There is no height or weight on file to calculate BMI.        Physical Examination:   General:  Alert, oriented and cooperative.   Mental Status: Normal mood and affect perceived. Normal judgment and thought content.  Rest of physical exam deferred due to type of encounter  No results found for this or any previous visit (from the past 24 hour(s)).  Assessment & Plan:  1) Pregnancy G1P0000 at [redacted]w[redacted]d with an Estimated Date of Delivery: 11/01/20     Meds: No orders of the defined types were placed in this encounter.   Labs/procedures today: none  Plan:  Continue routine obstetrical care with Tdap at next visit.  Has home bp cuff.  Check bp weekly, let us know if >140/90.  Next visit: prefers will be in person for Tdap    Reviewed: Preterm labor symptoms and general obstetric precautions including but not limited to vaginal bleeding, contractions, leaking of fluid and fetal movement were reviewed in detail with the patient. The patient was advised to call back or seek an in-person office evaluation/go to [facility] for any urgent or concerning symptoms. All questions were answered. Please refer to After Visit Summary for other counseling recommendations.    I provided 5 minutes of non-face-to-face time during this encounter.  Follow-up: Return in about 2 weeks (around 09/08/2020) for LROB, in person.  No orders of the defined types  were placed in this encounter.  Arabella Merles CNM 08/25/2020 11:42 AM

## 2020-09-08 ENCOUNTER — Encounter: Payer: BLUE CROSS/BLUE SHIELD | Admitting: Advanced Practice Midwife

## 2020-09-08 ENCOUNTER — Other Ambulatory Visit: Payer: Self-pay

## 2020-09-08 ENCOUNTER — Encounter: Payer: Self-pay | Admitting: Advanced Practice Midwife

## 2020-09-08 ENCOUNTER — Ambulatory Visit (INDEPENDENT_AMBULATORY_CARE_PROVIDER_SITE_OTHER): Payer: BLUE CROSS/BLUE SHIELD | Admitting: Advanced Practice Midwife

## 2020-09-08 VITALS — BP 115/72 | HR 91 | Wt 227.8 lb

## 2020-09-08 DIAGNOSIS — Z331 Pregnant state, incidental: Secondary | ICD-10-CM

## 2020-09-08 DIAGNOSIS — Z3A32 32 weeks gestation of pregnancy: Secondary | ICD-10-CM | POA: Diagnosis not present

## 2020-09-08 DIAGNOSIS — Z23 Encounter for immunization: Secondary | ICD-10-CM | POA: Diagnosis not present

## 2020-09-08 DIAGNOSIS — Z3403 Encounter for supervision of normal first pregnancy, third trimester: Secondary | ICD-10-CM | POA: Diagnosis not present

## 2020-09-08 DIAGNOSIS — Z1389 Encounter for screening for other disorder: Secondary | ICD-10-CM

## 2020-09-08 LAB — POCT URINALYSIS DIPSTICK OB
Glucose, UA: NEGATIVE
Ketones, UA: NEGATIVE
Leukocytes, UA: NEGATIVE
Nitrite, UA: NEGATIVE

## 2020-09-08 NOTE — Patient Instructions (Signed)
Christine Barajas, I greatly value your feedback.  If you receive a survey following your visit with [place] today, we appreciate you taking the time to fill it out.  Thanks, Philipp Deputy, CNM   [facility] ([address] [place], [place] [address]) Entrance C, located off of [address] valet parking  Go to [email or web address] to register for FREE online childbirth classes   Call the office [phone]) or go to [facility] if:  You begin to have strong, frequent contractions  Your water breaks.  Sometimes it is a big gush of fluid, sometimes it is just a trickle that keeps getting your panties wet or running down your legs  You have vaginal bleeding.  It is normal to have a small amount of spotting if your cervix was checked.   You don't feel your baby moving like normal.  If you don't, get you something to eat and drink and lay down and focus on feeling your baby move.  You should feel at least 10 movements in 2 hours.  If you don't, you should call the office or go to [facility].    Tdap Vaccine  It is recommended that you get the Tdap vaccine during the third trimester of EACH pregnancy to help protect your baby from getting pertussis (whooping cough)  27-36 weeks is the BEST time to do this so that you can pass the protection on to your baby. During pregnancy is better than after pregnancy, but if you are unable to get it during pregnancy it will be offered at the hospital.   You can get this vaccine with [place], at the health department, your family doctor, or some local pharmacies  Everyone who will be around your baby should also be up-to-date on their vaccines before the baby comes. Adults (who are not pregnant) only need 1 dose of Tdap during adulthood.   [place] Pediatricians/Family Doctors:  [facility] [phone]            [facility] [phone]                 [facility] [phone]  (usually not accepting new patients unless you have family there already, you are always welcome to call and ask)       [facility] [phone]       [facility] Pediatricians/Family Doctors:   [facility]: [phone]  [facility]: [phone]  [facility]: [phone]  [facility] Doctors:   [facility]: [phone]   [facility]: [phone]  [facility] Doctors:  [facility]: [phone]   Home Blood Pressure Monitoring for Patients   Your provider has recommended that you check your blood pressure (BP) at least once a week at home. If you do not have a blood pressure cuff at home, one will be provided for you. Contact your provider if you have not received your monitor within 1 week.   Helpful Tips for Accurate Home Blood Pressure Checks  . Don't smoke, exercise, or drink caffeine 30 minutes before checking your BP . Use the restroom before checking your BP (a full bladder can raise your pressure) . Relax in a comfortable upright chair . Feet on the ground . Left arm resting comfortably on a flat surface at the level of your heart . Legs uncrossed . Back supported . Sit quietly and don't talk . Place the cuff on your bare  arm . Adjust snuggly, so that only two fingertips can fit between your skin and the top of the cuff . Check 2 readings separated by at least one minute . Keep a log of your BP readings . For a visual, please reference this diagram: http://ccnc.care/bpdiagram  Provider Name: [facility]     Phone: [phone]  Zone 1: ALL CLEAR  Continue to monitor your symptoms:  . BP reading is less than 140 (top number) or less than 90 (bottom number)  . No right upper stomach pain . No headaches or seeing spots . No feeling nauseated or throwing up . No swelling in face and hands  Zone 2: CAUTION Call your doctor's office for  any of the following:  . BP reading is greater than 140 (top number) or greater than 90 (bottom number)  . Stomach pain under your ribs in the middle or right side . Headaches or seeing spots . Feeling nauseated or throwing up . Swelling in face and hands  Zone 3: EMERGENCY  Seek immediate medical care if you have any of the following:  . BP reading is greater than160 (top number) or greater than 110 (bottom number) . Severe headaches not improving with Tylenol . Serious difficulty catching your breath . Any worsening symptoms from Zone 2   Third Trimester of Pregnancy The third trimester is from week 29 through week 42, months 7 through 9. The third trimester is a time when the fetus is growing rapidly. At the end of the ninth month, the fetus is about 20 inches in length and weighs 6-10 pounds.  BODY CHANGES Your body goes through many changes during pregnancy. The changes vary from woman to woman.   Your weight will continue to increase. You can expect to gain 25-35 pounds (11-16 kg) by the end of the pregnancy.  You may begin to get stretch marks on your hips, abdomen, and breasts.  You may urinate more often because the fetus is moving lower into your pelvis and pressing on your bladder.  You may develop or continue to have heartburn as a result of your pregnancy.  You may develop constipation because certain hormones are causing the muscles that push waste through your intestines to slow down.  You may develop hemorrhoids or swollen, bulging veins (varicose veins).  You may have pelvic pain because of the weight gain and pregnancy hormones relaxing your joints between the bones in your pelvis. Backaches may result from overexertion of the muscles supporting your posture.  You may have changes in your hair. These can include thickening of your hair, rapid growth, and changes in texture. Some women also have hair loss during or after pregnancy, or hair that feels dry or thin.  Your hair will most likely return to normal after your baby is born.  Your breasts will continue to grow and be tender. A yellow discharge may leak from your breasts called colostrum.  Your belly button may stick out.  You may feel short of breath because of your expanding uterus.  You may notice the fetus "dropping," or moving lower in your abdomen.  You may have a bloody mucus discharge. This usually occurs a few days to a week before labor begins.  Your cervix becomes thin and soft (effaced) near your due date. WHAT TO EXPECT AT YOUR PRENATAL EXAMS  You will have prenatal exams every 2 weeks until week 36. Then, you will have weekly prenatal exams. During a routine prenatal visit:  You  will be weighed to make sure you and the fetus are growing normally.  Your blood pressure is taken.  Your abdomen will be measured to track your baby's growth.  The fetal heartbeat will be listened to.  Any test results from the previous visit will be discussed.  You may have a cervical check near your due date to see if you have effaced. At around 36 weeks, your caregiver will check your cervix. At the same time, your caregiver will also perform a test on the secretions of the vaginal tissue. This test is to determine if a type of bacteria, Group B streptococcus, is present. Your caregiver will explain this further. Your caregiver may ask you:  What your birth plan is.  How you are feeling.  If you are feeling the baby move.  If you have had any abnormal symptoms, such as leaking fluid, bleeding, severe headaches, or abdominal cramping.  If you have any questions. Other tests or screenings that may be performed during your third trimester include:  Blood tests that check for low iron levels (anemia).  Fetal testing to check the health, activity level, and growth of the fetus. Testing is done if you have certain medical conditions or if there are problems during the pregnancy. FALSE  LABOR You may feel small, irregular contractions that eventually go away. These are called Braxton Hicks contractions, or false labor. Contractions may last for hours, days, or even weeks before true labor sets in. If contractions come at regular intervals, intensify, or become painful, it is best to be seen by your caregiver.  SIGNS OF LABOR   Menstrual-like cramps.  Contractions that are 5 minutes apart or less.  Contractions that start on the top of the uterus and spread down to the lower abdomen and back.  A sense of increased pelvic pressure or back pain.  A watery or bloody mucus discharge that comes from the vagina. If you have any of these signs before the 37th week of pregnancy, call your caregiver right away. You need to go to the hospital to get checked immediately. HOME CARE INSTRUCTIONS   Avoid all smoking, herbs, alcohol, and unprescribed drugs. These chemicals affect the formation and growth of the baby.  Follow your caregiver's instructions regarding medicine use. There are medicines that are either safe or unsafe to take during pregnancy.  Exercise only as directed by your caregiver. Experiencing uterine cramps is a good sign to stop exercising.  Continue to eat regular, healthy meals.  Wear a good support bra for breast tenderness.  Do not use hot tubs, steam rooms, or saunas.  Wear your seat belt at all times when driving.  Avoid raw meat, uncooked cheese, cat litter boxes, and soil used by cats. These carry germs that can cause birth defects in the baby.  Take your prenatal vitamins.  Try taking a stool softener (if your caregiver approves) if you develop constipation. Eat more high-fiber foods, such as fresh vegetables or fruit and whole grains. Drink plenty of fluids to keep your urine clear or pale yellow.  Take warm sitz baths to soothe any pain or discomfort caused by hemorrhoids. Use hemorrhoid cream if your caregiver approves.  If you develop varicose  veins, wear support hose. Elevate your feet for 15 minutes, 3-4 times a day. Limit salt in your diet.  Avoid heavy lifting, wear low heal shoes, and practice good posture.  Rest a lot with your legs elevated if you have leg cramps or low back  pain.  Visit your dentist if you have not gone during your pregnancy. Use a soft toothbrush to brush your teeth and be gentle when you floss.  A sexual relationship may be continued unless your caregiver directs you otherwise.  Do not travel far distances unless it is absolutely necessary and only with the approval of your caregiver.  Take prenatal classes to understand, practice, and ask questions about the labor and delivery.  Make a trial run to the hospital.  Pack your hospital bag.  Prepare the baby's nursery.  Continue to go to all your prenatal visits as directed by your caregiver. SEEK MEDICAL CARE IF:  You are unsure if you are in labor or if your water has broken.  You have dizziness.  You have mild pelvic cramps, pelvic pressure, or nagging pain in your abdominal area.  You have persistent nausea, vomiting, or diarrhea.  You have a bad smelling vaginal discharge.  You have pain with urination. SEEK IMMEDIATE MEDICAL CARE IF:   You have a fever.  You are leaking fluid from your vagina.  You have spotting or bleeding from your vagina.  You have severe abdominal cramping or pain.  You have rapid weight loss or gain.  You have shortness of breath with chest pain.  You notice sudden or extreme swelling of your face, hands, ankles, feet, or legs.  You have not felt your baby move in over an hour.  You have severe headaches that do not go away with medicine.  You have vision changes. Document Released: 11/14/2001 Document Revised: 11/25/2013 Document Reviewed: 01/21/2013 [employer] Patient Information 2015 [place], [place]. This information is not intended to replace advice given to you by your health care provider. Make  sure you discuss any questions you have with your health care provider.

## 2020-09-08 NOTE — Progress Notes (Signed)
   LOW-RISK PREGNANCY VISIT Patient name: Christine Barajas MRN [id number]  Date of birth: 03-10-1996 Chief Complaint:   Routine Prenatal Visit (pain in groin area; worse in the am)  History of Present Illness:   Christine Barajas is a 24 y.o. G63P0000 female at [redacted]w[redacted]d with an Estimated Date of Delivery: 11/01/20 being seen today for ongoing management of a low-risk pregnancy.  Today she reports groin discomfort in the morning that resolves as the day goes on. Contractions: Not present. Vag. Bleeding: None.  Movement: Present. denies leaking of fluid. Review of Systems:   Pertinent items are noted in HPI Denies abnormal vaginal discharge w/ itching/odor/irritation, headaches, visual changes, shortness of breath, chest pain, abdominal pain, severe nausea/vomiting, or problems with urination or bowel movements unless otherwise stated above. Pertinent History Reviewed:  Reviewed past medical,surgical, social, obstetrical and family history.  Reviewed problem list, medications and allergies. Physical Assessment:   Vitals:   09/08/20 0901  BP: 115/72  Pulse: 91  Weight: 103.3 kg  Body mass index is 44.49 kg/m.        Physical Examination:   General appearance: Well appearing, and in no distress  Mental status: Alert, oriented to person, place, and time  Skin: Warm & dry  Cardiovascular: Normal heart rate noted  Respiratory: Normal respiratory effort, no distress  Abdomen: Soft, gravid, nontender  Pelvic: Cervical exam deferred         Extremities: Edema: Trace  Fetal Status: Fetal Heart Rate (bpm): 135 Fundal Height: 33 cm Movement: Present    Results for orders placed or performed in visit on 09/08/20 (from the past 24 hour(s))  POC Urinalysis Dipstick OB   Collection Time: 09/08/20  8:58 AM  Result Value Ref Range   Color, UA     Clarity, UA     Glucose, UA Negative Negative   Bilirubin, UA     Ketones, UA neg    Spec Grav, UA     Blood, UA trace    pH, UA     POC,PROTEIN,UA  Trace Negative, Trace, Small (1+), Moderate (2+), Large (3+), 4+   Urobilinogen, UA     Nitrite, UA neg    Leukocytes, UA Negative Negative   Appearance     Odor      Assessment & Plan:  1) Low-risk pregnancy G1P0000 at [redacted]w[redacted]d with an Estimated Date of Delivery: 11/01/20   2) Groin discomfort, reviewed changing between-the-knees pillow positions/shape   Meds: No orders of the defined types were placed in this encounter.  Labs/procedures today: Tdap today  Plan:  Continue routine obstetrical care   Reviewed: Preterm labor symptoms and general obstetric precautions including but not limited to vaginal bleeding, contractions, leaking of fluid and fetal movement were reviewed in detail with the patient.  All questions were answered. Has home bp cuff. Check bp weekly, let us know if >140/90.   Follow-up: Return in about 2 weeks (around 09/22/2020) for LROB, in person.  Orders Placed This Encounter  Procedures  . Tdap vaccine greater than or equal to 7yo IM  . POC Urinalysis Dipstick OB   Arabella Merles [facility] 09/08/2020 9:24 AM

## 2020-09-22 ENCOUNTER — Encounter: Payer: Self-pay | Admitting: Obstetrics and Gynecology

## 2020-09-22 ENCOUNTER — Ambulatory Visit (INDEPENDENT_AMBULATORY_CARE_PROVIDER_SITE_OTHER): Payer: BLUE CROSS/BLUE SHIELD | Admitting: Obstetrics and Gynecology

## 2020-09-22 VITALS — BP 131/82 | HR 106 | Wt 231.8 lb

## 2020-09-22 DIAGNOSIS — Z3403 Encounter for supervision of normal first pregnancy, third trimester: Secondary | ICD-10-CM

## 2020-09-22 NOTE — Progress Notes (Signed)
Subjective:  Christine Barajas is a 23 y.o. G1P0000 at [redacted]w[redacted]d being seen today for ongoing prenatal care.  She is currently monitored for the following issues for this low-risk pregnancy and has Supervision of normal first pregnancy on their problem list.  Patient reports general discomforts of pregnancy.  Contractions: Not present. Vag. Bleeding: None.  Movement: Present. Denies leaking of fluid.   The following portions of the patient's history were reviewed and updated as appropriate: allergies, current medications, past family history, past medical history, past social history, past surgical history and problem list. Problem list updated.  Objective:   Vitals:   09/22/20 0836  BP: 131/82  Pulse: (!) 106  Weight: 231 lb 12.8 oz (105.1 kg)    Fetal Status:     Movement: Present     General:  Alert, oriented and cooperative. Patient is in no acute distress.  Skin: Skin is warm and dry. No rash noted.   Cardiovascular: Normal heart rate noted  Respiratory: Normal respiratory effort, no problems with respiration noted  Abdomen: Soft, gravid, appropriate for gestational age. Pain/Pressure: Absent     Pelvic:  Cervical exam deferred        Extremities: Normal range of motion.  Edema: Trace  Mental Status: Normal mood and affect. Normal behavior. Normal judgment and thought content.   Urinalysis:      Assessment and Plan:  Pregnancy: G1P0000 at [redacted]w[redacted]d  1. Encounter for supervision of normal first pregnancy in third trimester Stable GBS next visit  Preterm labor symptoms and general obstetric precautions including but not limited to vaginal bleeding, contractions, leaking of fluid and fetal movement were reviewed in detail with the patient. Please refer to After Visit Summary for other counseling recommendations.  Return in about 2 weeks (around 10/06/2020) for OB visit, face to face, any provider.   Hermina Staggers, MD

## 2020-09-22 NOTE — Patient Instructions (Signed)
Third Trimester of Pregnancy The third trimester is from week 28 through week 40 (months 7 through 9). The third trimester is a time when the unborn baby (fetus) is growing rapidly. At the end of the ninth month, the fetus is about 20 inches in length and weighs 6-10 pounds. Body changes during your third trimester Your body will continue to go through many changes during pregnancy. The changes vary from woman to woman. During the third trimester:  Your weight will continue to increase. You can expect to gain 25-35 pounds (11-16 kg) by the end of the pregnancy.  You may begin to get stretch marks on your hips, abdomen, and breasts.  You may urinate more often because the fetus is moving lower into your pelvis and pressing on your bladder.  You may develop or continue to have heartburn. This is caused by increased hormones that slow down muscles in the digestive tract.  You may develop or continue to have constipation because increased hormones slow digestion and cause the muscles that push waste through your intestines to relax.  You may develop hemorrhoids. These are swollen veins (varicose veins) in the rectum that can itch or be painful.  You may develop swollen, bulging veins (varicose veins) in your legs.  You may have increased body aches in the pelvis, back, or thighs. This is due to weight gain and increased hormones that are relaxing your joints.  You may have changes in your hair. These can include thickening of your hair, rapid growth, and changes in texture. Some women also have hair loss during or after pregnancy, or hair that feels dry or thin. Your hair will most likely return to normal after your baby is born.  Your breasts will continue to grow and they will continue to become tender. A yellow fluid (colostrum) may leak from your breasts. This is the first milk you are producing for your baby.  Your belly button may stick out.  You may notice more swelling in your hands,  face, or ankles.  You may have increased tingling or numbness in your hands, arms, and legs. The skin on your belly may also feel numb.  You may feel short of breath because of your expanding uterus.  You may have more problems sleeping. This can be caused by the size of your belly, increased need to urinate, and an increase in your body's metabolism.  You may notice the fetus "dropping," or moving lower in your abdomen (lightening).  You may have increased vaginal discharge.  You may notice your joints feel loose and you may have pain around your pelvic bone. What to expect at prenatal visits You will have prenatal exams every 2 weeks until week 36. Then you will have weekly prenatal exams. During a routine prenatal visit:  You will be weighed to make sure you and the baby are growing normally.  Your blood pressure will be taken.  Your abdomen will be measured to track your baby's growth.  The fetal heartbeat will be listened to.  Any test results from the previous visit will be discussed.  You may have a cervical check near your due date to see if your cervix has softened or thinned (effaced).  You will be tested for Group B streptococcus. This happens between 35 and 37 weeks. Your health care provider may ask you:  What your birth plan is.  How you are feeling.  If you are feeling the baby move.  If you have had any abnormal   symptoms, such as leaking fluid, bleeding, severe headaches, or abdominal cramping.  If you are using any tobacco products, including cigarettes, chewing tobacco, and electronic cigarettes.  If you have any questions. Other tests or screenings that may be performed during your third trimester include:  Blood tests that check for low iron levels (anemia).  Fetal testing to check the health, activity level, and growth of the fetus. Testing is done if you have certain medical conditions or if there are problems during the pregnancy.  Nonstress test  (NST). This test checks the health of your baby to make sure there are no signs of problems, such as the baby not getting enough oxygen. During this test, a belt is placed around your belly. The baby is made to move, and its heart rate is monitored during movement. What is false labor? False labor is a condition in which you feel small, irregular tightenings of the muscles in the womb (contractions) that usually go away with rest, changing position, or drinking water. These are called Braxton Hicks contractions. Contractions may last for hours, days, or even weeks before true labor sets in. If contractions come at regular intervals, become more frequent, increase in intensity, or become painful, you should see your health care provider. What are the signs of labor?  Abdominal cramps.  Regular contractions that start at 10 minutes apart and become stronger and more frequent with time.  Contractions that start on the top of the uterus and spread down to the lower abdomen and back.  Increased pelvic pressure and dull back pain.  A watery or bloody mucus discharge that comes from the vagina.  Leaking of amniotic fluid. This is also known as your "water breaking." It could be a slow trickle or a gush. Let your health care provider know if it has a color or strange odor. If you have any of these signs, call your health care provider right away, even if it is before your due date. Follow these instructions at home: Medicines  Follow your health care provider's instructions regarding medicine use. Specific medicines may be either safe or unsafe to take during pregnancy.  Take a prenatal vitamin that contains at least 600 micrograms (mcg) of folic acid.  If you develop constipation, try taking a stool softener if your health care provider approves. Eating and drinking   Eat a balanced diet that includes fresh fruits and vegetables, whole grains, good sources of protein such as meat, eggs, or tofu,  and low-fat dairy. Your health care provider will help you determine the amount of weight gain that is right for you.  Avoid raw meat and uncooked cheese. These carry germs that can cause birth defects in the baby.  If you have low calcium intake from food, talk to your health care provider about whether you should take a daily calcium supplement.  Eat four or five small meals rather than three large meals a day.  Limit foods that are high in fat and processed sugars, such as fried and sweet foods.  To prevent constipation: ? Drink enough fluid to keep your urine clear or pale yellow. ? Eat foods that are high in fiber, such as fresh fruits and vegetables, whole grains, and beans. Activity  Exercise only as directed by your health care provider. Most women can continue their usual exercise routine during pregnancy. Try to exercise for 30 minutes at least 5 days a week. Stop exercising if you experience uterine contractions.  Avoid heavy lifting.  Do   not exercise in extreme heat or humidity, or at high altitudes.  Wear low-heel, comfortable shoes.  Practice good posture.  You may continue to have sex unless your health care provider tells you otherwise. Relieving pain and discomfort  Take frequent breaks and rest with your legs elevated if you have leg cramps or low back pain.  Take warm sitz baths to soothe any pain or discomfort caused by hemorrhoids. Use hemorrhoid cream if your health care provider approves.  Wear a good support bra to prevent discomfort from breast tenderness.  If you develop varicose veins: ? Wear support pantyhose or compression stockings as told by your healthcare provider. ? Elevate your feet for 15 minutes, 3-4 times a day. Prenatal care  Write down your questions. Take them to your prenatal visits.  Keep all your prenatal visits as told by your health care provider. This is important. Safety  Wear your seat belt at all times when driving.  Make  a list of emergency phone numbers, including numbers for family, friends, the hospital, and police and fire departments. General instructions  Avoid cat litter boxes and soil used by cats. These carry germs that can cause birth defects in the baby. If you have a cat, ask someone to clean the litter box for you.  Do not travel far distances unless it is absolutely necessary and only with the approval of your health care provider.  Do not use hot tubs, steam rooms, or saunas.  Do not drink alcohol.  Do not use any products that contain nicotine or tobacco, such as cigarettes and e-cigarettes. If you need help quitting, ask your health care provider.  Do not use any medicinal herbs or unprescribed drugs. These chemicals affect the formation and growth of the baby.  Do not douche or use tampons or scented sanitary pads.  Do not cross your legs for long periods of time.  To prepare for the arrival of your baby: ? Take prenatal classes to understand, practice, and ask questions about labor and delivery. ? Make a trial run to the hospital. ? Visit the hospital and tour the maternity area. ? Arrange for maternity or paternity leave through employers. ? Arrange for family and friends to take care of pets while you are in the hospital. ? Purchase a rear-facing car seat and make sure you know how to install it in your car. ? Pack your hospital bag. ? Prepare the baby's nursery. Make sure to remove all pillows and stuffed animals from the baby's crib to prevent suffocation.  Visit your dentist if you have not gone during your pregnancy. Use a soft toothbrush to brush your teeth and be gentle when you floss. Contact a health care provider if:  You are unsure if you are in labor or if your water has broken.  You become dizzy.  You have mild pelvic cramps, pelvic pressure, or nagging pain in your abdominal area.  You have lower back pain.  You have persistent nausea, vomiting, or  diarrhea.  You have an unusual or bad smelling vaginal discharge.  You have pain when you urinate. Get help right away if:  Your water breaks before 37 weeks.  You have regular contractions less than 5 minutes apart before 37 weeks.  You have a fever.  You are leaking fluid from your vagina.  You have spotting or bleeding from your vagina.  You have severe abdominal pain or cramping.  You have rapid weight loss or weight gain.  You have   shortness of breath with chest pain.  You notice sudden or extreme swelling of your face, hands, ankles, feet, or legs.  Your baby makes fewer than 10 movements in 2 hours.  You have severe headaches that do not go away when you take medicine.  You have vision changes. Summary  The third trimester is from week 28 through week 40, months 7 through 9. The third trimester is a time when the unborn baby (fetus) is growing rapidly.  During the third trimester, your discomfort may increase as you and your baby continue to gain weight. You may have abdominal, leg, and back pain, sleeping problems, and an increased need to urinate.  During the third trimester your breasts will keep growing and they will continue to become tender. A yellow fluid (colostrum) may leak from your breasts. This is the first milk you are producing for your baby.  False labor is a condition in which you feel small, irregular tightenings of the muscles in the womb (contractions) that eventually go away. These are called Braxton Hicks contractions. Contractions may last for hours, days, or even weeks before true labor sets in.  Signs of labor can include: abdominal cramps; regular contractions that start at 10 minutes apart and become stronger and more frequent with time; watery or bloody mucus discharge that comes from the vagina; increased pelvic pressure and dull back pain; and leaking of amniotic fluid. This information is not intended to replace advice given to you by your  health care provider. Make sure you discuss any questions you have with your health care provider. Document Revised: 03/13/2019 Document Reviewed: 12/26/2016 Elsevier Patient Education  2020 [employer]  

## 2020-10-06 ENCOUNTER — Ambulatory Visit (INDEPENDENT_AMBULATORY_CARE_PROVIDER_SITE_OTHER): Payer: BLUE CROSS/BLUE SHIELD | Admitting: Advanced Practice Midwife

## 2020-10-06 ENCOUNTER — Other Ambulatory Visit (HOSPITAL_COMMUNITY)
Admission: RE | Admit: 2020-10-06 | Discharge: 2020-10-06 | Disposition: A | Discharge status: Home / Self Care | Payer: BLUE CROSS/BLUE SHIELD | Source: Ambulatory Visit | Attending: Advanced Practice Midwife | Admitting: Advanced Practice Midwife

## 2020-10-06 VITALS — BP 125/72 | HR 79 | Wt 235.2 lb

## 2020-10-06 DIAGNOSIS — Z3403 Encounter for supervision of normal first pregnancy, third trimester: Secondary | ICD-10-CM

## 2020-10-06 DIAGNOSIS — Z1389 Encounter for screening for other disorder: Secondary | ICD-10-CM

## 2020-10-06 DIAGNOSIS — Z3A36 36 weeks gestation of pregnancy: Secondary | ICD-10-CM

## 2020-10-06 DIAGNOSIS — Z331 Pregnant state, incidental: Secondary | ICD-10-CM

## 2020-10-06 DIAGNOSIS — O26843 Uterine size-date discrepancy, third trimester: Secondary | ICD-10-CM | POA: Insufficient documentation

## 2020-10-06 LAB — POCT URINALYSIS DIPSTICK OB
Blood, UA: NEGATIVE
Glucose, UA: NEGATIVE
Leukocytes, UA: NEGATIVE
Nitrite, UA: NEGATIVE
POC,PROTEIN,UA: NEGATIVE

## 2020-10-06 NOTE — Progress Notes (Signed)
   LOW-RISK PREGNANCY VISIT Patient name: Christine Barajas MRN [id number]  Date of birth: [birth year] Chief Complaint:   Routine Prenatal Visit  History of Present Illness:   Christine Barajas is a 24 y.o. G33P0000 female at [redacted]w[redacted]d with an Estimated Date of Delivery: 11/01/20 being seen today for ongoing management of a low-risk pregnancy.  Today she reports having some BH ctx over the past few days, intermittent. Contractions: Irritability. Vag. Bleeding: None.  Movement: Present. denies leaking of fluid. Review of Systems:   Pertinent items are noted in HPI Denies abnormal vaginal discharge w/ itching/odor/irritation, headaches, visual changes, shortness of breath, chest pain, abdominal pain, severe nausea/vomiting, or problems with urination or bowel movements unless otherwise stated above. Pertinent History Reviewed:  Reviewed past medical,surgical, social, obstetrical and family history.  Reviewed problem list, medications and allergies. Physical Assessment:   Vitals:   10/06/20 0844  BP: 125/72  Pulse: 79  Weight: 235 lb 3.2 oz (106.7 kg)  Body mass index is 45.93 kg/m.        Physical Examination:   General appearance: Well appearing, and in no distress  Mental status: Alert, oriented to person, place, and time  Skin: Warm & dry  Cardiovascular: Normal heart rate noted  Respiratory: Normal respiratory effort, no distress  Abdomen: Soft, gravid, nontender  Pelvic: Cervical exam performed  Dilation: Closed Effacement (%): Thick Station: Ballotable- ? vertex  Extremities: Edema: Trace  Fetal Status: Fetal Heart Rate (bpm): 141 Fundal Height: 39 cm Movement: Present    Results for orders placed or performed in visit on 10/06/20 (from the past 24 hour(s))  POC Urinalysis Dipstick OB   Collection Time: 10/06/20  8:43 AM  Result Value Ref Range   Color, UA     Clarity, UA     Glucose, UA Negative Negative   Bilirubin, UA     Ketones, UA trace    Spec Grav, UA     Blood, UA  neg    pH, UA     POC,PROTEIN,UA Negative Negative, Trace, Small (1+), Moderate (2+), Large (3+), 4+   Urobilinogen, UA     Nitrite, UA neg    Leukocytes, UA Negative Negative   Appearance     Odor      Assessment & Plan:  1) Low-risk pregnancy G1P0000 at [redacted]w[redacted]d with an Estimated Date of Delivery: 11/01/20   2) S>D, will get u/s for EFW at next visit  3) Labile BPs at home, nothing >90 DBP, but SBP is around 140 at times; continue to watch   Meds: No orders of the defined types were placed in this encounter.  Labs/procedures today: GBS/GC/chlam/SVE  Plan:  Continue routine obstetrical care with growth u/s  Reviewed: Term labor symptoms and general obstetric precautions including but not limited to vaginal bleeding, contractions, leaking of fluid and fetal movement were reviewed in detail with the patient.  All questions were answered. Has home bp cuff. Check bp weekly, let us know if >140/90.   Follow-up: Return in about 1 week (around 10/13/2020) for LROB, in person, [place]: EFW.  Orders Placed This Encounter  Procedures  . Culture, beta strep (group b only)  . US OB Follow Up  . POC Urinalysis Dipstick OB   Arabella Merles [facility] 10/06/2020 9:09 AM

## 2020-10-06 NOTE — Patient Instructions (Signed)
Braxton Hicks Contractions °Contractions of the uterus can occur throughout pregnancy, but they are not always a sign that you are in labor. You may have practice contractions called Braxton Hicks contractions. These false labor contractions are sometimes confused with true labor. °What are Braxton Hicks contractions? °Braxton Hicks contractions are tightening movements that occur in the muscles of the uterus before labor. Unlike true labor contractions, these contractions do not result in opening (dilation) and thinning of the cervix. Toward the end of pregnancy (32-34 weeks), Braxton Hicks contractions can happen more often and may become stronger. These contractions are sometimes difficult to tell apart from true labor because they can be very uncomfortable. You should not feel embarrassed if you go to the hospital with false labor. °Sometimes, the only way to tell if you are in true labor is for your health care provider to look for changes in the cervix. The health care provider will do a physical exam and may monitor your contractions. If you are not in true labor, the exam should show that your cervix is not dilating and your water has not broken. °If there are no other health problems associated with your pregnancy, it is completely safe for you to be sent home with false labor. You may continue to have Braxton Hicks contractions until you go into true labor. °How to tell the difference between true labor and false labor °True labor °· Contractions last 30-70 seconds. °· Contractions become very regular. °· Discomfort is usually felt in the top of the uterus, and it spreads to the lower abdomen and low back. °· Contractions do not go away with walking. °· Contractions usually become more intense and increase in frequency. °· The cervix dilates and gets thinner. °False labor °· Contractions are usually shorter and not as strong as true labor contractions. °· Contractions are usually irregular. °· Contractions  are often felt in the front of the lower abdomen and in the groin. °· Contractions may go away when you walk around or change positions while lying down. °· Contractions get weaker and are shorter-lasting as time goes on. °· The cervix usually does not dilate or become thin. °Follow these instructions at home: ° °· Take over-the-counter and prescription medicines only as told by your health care provider. °· Keep up with your usual exercises and follow other instructions from your health care provider. °· Eat and drink lightly if you think you are going into labor. °· If Braxton Hicks contractions are making you uncomfortable: °? Change your position from lying down or resting to walking, or change from walking to resting. °? Sit and rest in a tub of warm water. °? Drink enough fluid to keep your urine pale yellow. Dehydration may cause these contractions. °? Do slow and deep breathing several times an hour. °· Keep all follow-up prenatal visits as told by your health care provider. This is important. °Contact a health care provider if: °· You have a fever. °· You have continuous pain in your abdomen. °Get help right away if: °· Your contractions become stronger, more regular, and closer together. °· You have fluid leaking or gushing from your vagina. °· You pass blood-tinged mucus (bloody show). °· You have bleeding from your vagina. °· You have low back pain that you never had before. °· You feel your baby’s head pushing down and causing pelvic pressure. °· Your baby is not moving inside you as much as it used to. °Summary °· Contractions that occur before labor are   called Braxton Hicks contractions, false labor, or practice contractions. °· Braxton Hicks contractions are usually shorter, weaker, farther apart, and less regular than true labor contractions. True labor contractions usually become progressively stronger and regular, and they become more frequent. °· Manage discomfort from Braxton Hicks contractions  by changing position, resting in a warm bath, drinking plenty of water, or practicing deep breathing. °This information is not intended to replace advice given to you by your health care provider. Make sure you discuss any questions you have with your health care provider. °Document Revised: 11/02/2017 Document Reviewed: 04/05/2017 °Elsevier Patient Education © 2020 [employer] Inc[employer] ° °

## 2020-10-07 LAB — CERVICOVAGINAL ANCILLARY ONLY
Chlamydia: NEGATIVE
Comment: NEGATIVE
Comment: NORMAL
Neisseria Gonorrhea: NEGATIVE

## 2020-10-10 LAB — CULTURE, BETA STREP (GROUP B ONLY): Strep Gp B Culture: NEGATIVE

## 2020-10-13 ENCOUNTER — Other Ambulatory Visit: Payer: Self-pay

## 2020-10-13 ENCOUNTER — Ambulatory Visit (INDEPENDENT_AMBULATORY_CARE_PROVIDER_SITE_OTHER): Payer: BLUE CROSS/BLUE SHIELD | Admitting: Advanced Practice Midwife

## 2020-10-13 ENCOUNTER — Ambulatory Visit (INDEPENDENT_AMBULATORY_CARE_PROVIDER_SITE_OTHER): Payer: BLUE CROSS/BLUE SHIELD

## 2020-10-13 VITALS — BP 130/77 | HR 94 | Wt 234.0 lb

## 2020-10-13 DIAGNOSIS — Z1389 Encounter for screening for other disorder: Secondary | ICD-10-CM

## 2020-10-13 DIAGNOSIS — Z3A37 37 weeks gestation of pregnancy: Secondary | ICD-10-CM

## 2020-10-13 DIAGNOSIS — Z331 Pregnant state, incidental: Secondary | ICD-10-CM

## 2020-10-13 DIAGNOSIS — O26843 Uterine size-date discrepancy, third trimester: Secondary | ICD-10-CM | POA: Diagnosis not present

## 2020-10-13 DIAGNOSIS — Z3403 Encounter for supervision of normal first pregnancy, third trimester: Secondary | ICD-10-CM

## 2020-10-13 LAB — POCT URINALYSIS DIPSTICK OB
Blood, UA: NEGATIVE
Glucose, UA: NEGATIVE
Ketones, UA: NEGATIVE
Leukocytes, UA: NEGATIVE
Nitrite, UA: NEGATIVE
POC,PROTEIN,UA: NEGATIVE

## 2020-10-13 NOTE — Progress Notes (Signed)
[place] 37+2 wks,cephalic,anterior placenta gr 3,afi 154 bpm,AFI 14.7 cm,EFW 3491 g 84%

## 2020-10-13 NOTE — Patient Instructions (Signed)
Braxton Hicks Contractions °Contractions of the uterus can occur throughout pregnancy, but they are not always a sign that you are in labor. You may have practice contractions called Braxton Hicks contractions. These false labor contractions are sometimes confused with true labor. °What are Braxton Hicks contractions? °Braxton Hicks contractions are tightening movements that occur in the muscles of the uterus before labor. Unlike true labor contractions, these contractions do not result in opening (dilation) and thinning of the cervix. Toward the end of pregnancy (32-34 weeks), Braxton Hicks contractions can happen more often and may become stronger. These contractions are sometimes difficult to tell apart from true labor because they can be very uncomfortable. You should not feel embarrassed if you go to the hospital with false labor. °Sometimes, the only way to tell if you are in true labor is for your health care provider to look for changes in the cervix. The health care provider will do a physical exam and may monitor your contractions. If you are not in true labor, the exam should show that your cervix is not dilating and your water has not broken. °If there are no other health problems associated with your pregnancy, it is completely safe for you to be sent home with false labor. You may continue to have Braxton Hicks contractions until you go into true labor. °How to tell the difference between true labor and false labor °True labor °· Contractions last 30-70 seconds. °· Contractions become very regular. °· Discomfort is usually felt in the top of the uterus, and it spreads to the lower abdomen and low back. °· Contractions do not go away with walking. °· Contractions usually become more intense and increase in frequency. °· The cervix dilates and gets thinner. °False labor °· Contractions are usually shorter and not as strong as true labor contractions. °· Contractions are usually irregular. °· Contractions  are often felt in the front of the lower abdomen and in the groin. °· Contractions may go away when you walk around or change positions while lying down. °· Contractions get weaker and are shorter-lasting as time goes on. °· The cervix usually does not dilate or become thin. °Follow these instructions at home: ° °· Take over-the-counter and prescription medicines only as told by your health care provider. °· Keep up with your usual exercises and follow other instructions from your health care provider. °· Eat and drink lightly if you think you are going into labor. °· If Braxton Hicks contractions are making you uncomfortable: °? Change your position from lying down or resting to walking, or change from walking to resting. °? Sit and rest in a tub of warm water. °? Drink enough fluid to keep your urine pale yellow. Dehydration may cause these contractions. °? Do slow and deep breathing several times an hour. °· Keep all follow-up prenatal visits as told by your health care provider. This is important. °Contact a health care provider if: °· You have a fever. °· You have continuous pain in your abdomen. °Get help right away if: °· Your contractions become stronger, more regular, and closer together. °· You have fluid leaking or gushing from your vagina. °· You pass blood-tinged mucus (bloody show). °· You have bleeding from your vagina. °· You have low back pain that you never had before. °· You feel your baby’s head pushing down and causing pelvic pressure. °· Your baby is not moving inside you as much as it used to. °Summary °· Contractions that occur before labor are   called Braxton Hicks contractions, false labor, or practice contractions. °· Braxton Hicks contractions are usually shorter, weaker, farther apart, and less regular than true labor contractions. True labor contractions usually become progressively stronger and regular, and they become more frequent. °· Manage discomfort from Braxton Hicks contractions  by changing position, resting in a warm bath, drinking plenty of water, or practicing deep breathing. °This information is not intended to replace advice given to you by your health care provider. Make sure you discuss any questions you have with your health care provider. °Document Revised: 11/02/2017 Document Reviewed: 04/05/2017 °Elsevier Patient Education © 2020 [employer] Inc[employer] ° °

## 2020-10-13 NOTE — Progress Notes (Signed)
° °  LOW-RISK PREGNANCY VISIT Patient name: Christine Barajas MRN [id number]  Date of birth: November 22, 1996 Chief Complaint:   Routine Prenatal Visit  History of Present Illness:   Christine Barajas is a 24 y.o. G44P0000 female at [redacted]w[redacted]d with an Estimated Date of Delivery: 11/01/20 being seen today for ongoing management of a low-risk pregnancy.  Today she reports occ side pain and low abd pain that resolves. Contractions: Not present. Vag. Bleeding: None.  Movement: Present. denies leaking of fluid. Review of Systems:   Pertinent items are noted in HPI Denies abnormal vaginal discharge w/ itching/odor/irritation, headaches, visual changes, shortness of breath, chest pain, abdominal pain, severe nausea/vomiting, or problems with urination or bowel movements unless otherwise stated above. Pertinent History Reviewed:  Reviewed past medical,surgical, social, obstetrical and family history.  Reviewed problem list, medications and allergies. Physical Assessment:   Vitals:   10/13/20 0937  BP: 130/77  Pulse: 94  Weight: 234 lb (106.1 kg)  Body mass index is 45.7 kg/m.        Physical Examination:   General appearance: Well appearing, and in no distress  Mental status: Alert, oriented to person, place, and time  Skin: Warm & dry  Cardiovascular: Normal heart rate noted  Respiratory: Normal respiratory effort, no distress  Abdomen: Soft, gravid, nontender  Pelvic: Cervical exam deferred         Extremities: Edema: Trace  Fetal Status: Fetal Heart Rate (bpm): 154 u/s   Movement: Present     Growth u/s: [place] 37+2 wks,cephalic,anterior placenta gr 3,afi 154 bpm,AFI 14.7 cm,EFW 3491 g 84%  Results for orders placed or performed in visit on 10/13/20 (from the past 24 hour(s))  POC Urinalysis Dipstick OB   Collection Time: 10/13/20  9:38 AM  Result Value Ref Range   Color, UA     Clarity, UA     Glucose, UA Negative Negative   Bilirubin, UA     Ketones, UA neg    Spec Grav, UA     Blood, UA neg     pH, UA     POC,PROTEIN,UA Negative Negative, Trace, Small (1+), Moderate (2+), Large (3+), 4+   Urobilinogen, UA     Nitrite, UA neg    Leukocytes, UA Negative Negative   Appearance     Odor      Assessment & Plan:  1) Low-risk pregnancy G1P0000 at [redacted]w[redacted]d with an Estimated Date of Delivery: 11/01/20   2) S>D, EFW 84th%, cephalic   Meds: No orders of the defined types were placed in this encounter.  Labs/procedures today: growth scan  Plan:  Continue routine obstetrical care   Reviewed: Term labor symptoms and general obstetric precautions including but not limited to vaginal bleeding, contractions, leaking of fluid and fetal movement were reviewed in detail with the patient.  All questions were answered. Has home bp cuff. Check bp weekly, let us know if >140/90.   Follow-up: Return in about 1 week (around 10/20/2020) for LROB, in person.  Orders Placed This Encounter  Procedures   POC Urinalysis Dipstick OB   Arabella Merles [facility] 10/13/2020 9:47 AM

## 2020-10-22 ENCOUNTER — Other Ambulatory Visit: Payer: Self-pay

## 2020-10-22 ENCOUNTER — Encounter: Payer: Self-pay | Admitting: Obstetrics & Gynecology

## 2020-10-22 ENCOUNTER — Ambulatory Visit (INDEPENDENT_AMBULATORY_CARE_PROVIDER_SITE_OTHER): Payer: BLUE CROSS/BLUE SHIELD | Admitting: Obstetrics & Gynecology

## 2020-10-22 VITALS — Wt 238.5 lb

## 2020-10-22 DIAGNOSIS — Z3403 Encounter for supervision of normal first pregnancy, third trimester: Secondary | ICD-10-CM

## 2020-10-22 DIAGNOSIS — Z331 Pregnant state, incidental: Secondary | ICD-10-CM

## 2020-10-22 DIAGNOSIS — Z3A38 38 weeks gestation of pregnancy: Secondary | ICD-10-CM

## 2020-10-22 DIAGNOSIS — Z1389 Encounter for screening for other disorder: Secondary | ICD-10-CM

## 2020-10-22 LAB — POCT URINALYSIS DIPSTICK OB
Blood, UA: NEGATIVE
Glucose, UA: NEGATIVE
Ketones, UA: NEGATIVE
Leukocytes, UA: NEGATIVE
Nitrite, UA: NEGATIVE
POC,PROTEIN,UA: NEGATIVE

## 2020-10-22 NOTE — Progress Notes (Signed)
LOW-RISK PREGNANCY VISIT Patient name: Christine Barajas MRN [id number]  Date of birth: [birth year] Chief Complaint:   Routine Prenatal Visit  History of Present Illness:   Christine Barajas is a 24 y.o. G25P0000 female at [redacted]w[redacted]d with an Estimated Date of Delivery: 11/01/20 being seen today for ongoing management of a low-risk pregnancy.  Depression screen [facility] 2/9 04/19/2020 01/27/2019  Decreased Interest 1 0  Down, Depressed, Hopeless 1 0  PHQ - 2 Score 2 0  Altered sleeping 1 -  Tired, decreased energy 1 -  Change in appetite 1 -  Feeling bad or failure about yourself  0 -  Trouble concentrating 0 -  Moving slowly or fidgety/restless 1 -  Suicidal thoughts 0 -  PHQ-9 Score 6 -  Difficult doing work/chores Somewhat difficult -    Today she reports no complaints. Contractions: Not present. Vag. Bleeding: None.  Movement: Present. denies leaking of fluid. Review of Systems:   Pertinent items are noted in HPI Denies abnormal vaginal discharge w/ itching/odor/irritation, headaches, visual changes, shortness of breath, chest pain, abdominal pain, severe nausea/vomiting, or problems with urination or bowel movements unless otherwise stated above. Pertinent History Reviewed:  Reviewed past medical,surgical, social, obstetrical and family history.  Reviewed problem list, medications and allergies. Physical Assessment:   Vitals:   10/22/20 0853  Weight: 238 lb 8 oz (108.2 kg)  Body mass index is 46.58 kg/m.        Physical Examination:   General appearance: Well appearing, and in no distress  Mental status: Alert, oriented to person, place, and time  Skin: Warm & dry  Cardiovascular: Normal heart rate noted  Respiratory: Normal respiratory effort, no distress  Abdomen: Soft, gravid, nontender  Pelvic: Cervical exam performed  Dilation: Closed Effacement (%): Thick Station: -3  Extremities: Edema: Trace  Fetal Status: Fetal Heart Rate (bpm): 132 Fundal Height: 40 cm Movement:  Present Presentation: Vertex  Chaperone: Jobe Marker    Results for orders placed or performed in visit on 10/22/20 (from the past 24 hour(s))  POC Urinalysis Dipstick OB   Collection Time: 10/22/20  8:54 AM  Result Value Ref Range   Color, UA     Clarity, UA     Glucose, UA Negative Negative   Bilirubin, UA     Ketones, UA neg    Spec Grav, UA     Blood, UA neg    pH, UA     POC,PROTEIN,UA Negative Negative, Trace, Small (1+), Moderate (2+), Large (3+), 4+   Urobilinogen, UA     Nitrite, UA neg    Leukocytes, UA Negative Negative   Appearance     Odor      Assessment & Plan:  1) Low-risk pregnancy G1P0000 at [redacted]w[redacted]d with an Estimated Date of Delivery: 11/01/20   2) ,    Meds: No orders of the defined types were placed in this encounter.  Labs/procedures today:   Plan:  Continue routine obstetrical care  Next visit: prefers in person    Reviewed: Term labor symptoms and general obstetric precautions including but not limited to vaginal bleeding, contractions, leaking of fluid and fetal movement were reviewed in detail with the patient.  All questions were answered. has home bp cuff. Rx faxed to . Check bp weekly, let us know if >140/90.   Follow-up: Return in about 10 days (around 11/01/2020) for LROB.  Orders Placed This Encounter  Procedures  . POC Urinalysis Dipstick OB    Lazaro Arms, MD  10/22/2020 9:45 AM

## 2020-11-01 ENCOUNTER — Other Ambulatory Visit: Payer: Self-pay

## 2020-11-01 ENCOUNTER — Ambulatory Visit (INDEPENDENT_AMBULATORY_CARE_PROVIDER_SITE_OTHER): Payer: BLUE CROSS/BLUE SHIELD | Admitting: Advanced Practice Midwife

## 2020-11-01 ENCOUNTER — Telehealth (HOSPITAL_COMMUNITY): Payer: Self-pay | Admitting: *Deleted

## 2020-11-01 VITALS — BP 121/82 | HR 87 | Wt 241.0 lb

## 2020-11-01 DIAGNOSIS — Z3403 Encounter for supervision of normal first pregnancy, third trimester: Secondary | ICD-10-CM | POA: Diagnosis not present

## 2020-11-01 DIAGNOSIS — Z3A40 40 weeks gestation of pregnancy: Secondary | ICD-10-CM

## 2020-11-01 DIAGNOSIS — Z1389 Encounter for screening for other disorder: Secondary | ICD-10-CM

## 2020-11-01 DIAGNOSIS — O480 Post-term pregnancy: Secondary | ICD-10-CM

## 2020-11-01 DIAGNOSIS — Z331 Pregnant state, incidental: Secondary | ICD-10-CM

## 2020-11-01 LAB — POCT URINALYSIS DIPSTICK OB
Blood, UA: NEGATIVE
Glucose, UA: NEGATIVE
Ketones, UA: NEGATIVE
Leukocytes, UA: NEGATIVE
Nitrite, UA: NEGATIVE
POC,PROTEIN,UA: NEGATIVE

## 2020-11-01 NOTE — Progress Notes (Signed)
° °  LOW-RISK PREGNANCY VISIT Patient name: Christine Barajas MRN [id number]  Date of birth: 1996/05/15 Chief Complaint:   Routine Prenatal Visit  History of Present Illness:   Christine Barajas is a 24 y.o. G65P0000 female at [redacted]w[redacted]d with an Estimated Date of Delivery: 11/01/20 being seen today for ongoing management of a low-risk pregnancy.  Today she reports no complaints. Contractions: Irritability. Vag. Bleeding: None.  Movement: Present. denies leaking of fluid. Review of Systems:   Pertinent items are noted in HPI Denies abnormal vaginal discharge w/ itching/odor/irritation, headaches, visual changes, shortness of breath, chest pain, abdominal pain, severe nausea/vomiting, or problems with urination or bowel movements unless otherwise stated above. Pertinent History Reviewed:  Reviewed past medical,surgical, social, obstetrical and family history.  Reviewed problem list, medications and allergies. Physical Assessment:   Vitals:   11/01/20 0840  BP: 121/82  Pulse: 87  Weight: 241 lb (109.3 kg)  Body mass index is 47.07 kg/m.        Physical Examination:   General appearance: Well appearing, and in no distress  Mental status: Alert, oriented to person, place, and time  Skin: Warm & dry  Cardiovascular: Normal heart rate noted  Respiratory: Normal respiratory effort, no distress  Abdomen: Soft, gravid, nontender  Pelvic: Cervical exam performed  Dilation: 1 Effacement (%): 80 Station: -3  Extremities: Edema: Trace  Fetal Status: Fetal Heart Rate (bpm): 135   Movement: Present Presentation: Vertex NST: FHR baseline 135 bpm, Variability: moderate, Accelerations:present, Decelerations:  Absent= Cat 1/Reactive   Chaperone: Amanda Rash    Results for orders placed or performed in visit on 11/01/20 (from the past 24 hour(s))  POC Urinalysis Dipstick OB   Collection Time: 11/01/20  8:41 AM  Result Value Ref Range   Color, UA     Clarity, UA     Glucose, UA Negative Negative    Bilirubin, UA     Ketones, UA neg    Spec Grav, UA     Blood, UA neg    pH, UA     POC,PROTEIN,UA Negative Negative, Trace, Small (1+), Moderate (2+), Large (3+), 4+   Urobilinogen, UA     Nitrite, UA neg    Leukocytes, UA Negative Negative   Appearance     Odor      Assessment & Plan:  1) Low-risk pregnancy G1P0000 at [redacted]w[redacted]d with an Estimated Date of Delivery: 11/01/20   2) Impending postdates, IOL scheduled for 12/6 at [facility]   Meds: No orders of the defined types were placed in this encounter.  Labs/procedures today: NST  Plan:  Continue routine obstetrical care  Next visit: IOL scheduled for 12/6 at [facility] if undelivered.     Reviewed: Term labor symptoms and general obstetric precautions including but not limited to vaginal bleeding, contractions, leaking of fluid and fetal movement were reviewed in detail with the patient.  All questions were answered. Has home bp cuff. Rx. Check bp weekly, let us know if >140/90.   Follow-up: Return in about 6 weeks (around 12/13/2020) for for postpartum checkup.  Orders Placed This Encounter  Procedures   POC Urinalysis Dipstick OB   Jacklyn Shell DNP, CNM 11/01/2020 9:26 AM

## 2020-11-01 NOTE — Patient Instructions (Addendum)
  Go to Maternity Assessment Unit any time Sunday for a Foley balloon.    If you are still pregnant on 11/07/20 at 11:45 pm (yes, PM!) go to MAU and tell them you are there to be induced. You will go to [facility], ([address] in [place], [place]) to start your induction!  Eat a light meal before you come.  Daisey Must!!   OUTPATIENT FOLEY BULB INDUCTION OF LABOR:  Information Sheet for Mothers and Family               What's a Foley Bulb Induction? A Foley bulb induction is a procedure where your provider inserts a catheter into your cervix. Once inside your womb, your provider inflates the balloon with a saline solution.   This puts pressure on your cervix and encourages dilation. The catheter falls out once your cervix dilates to 3-4 centimeters.     With any procedure, it's important that you know what to expect. The insertion of a Foley catheter can be a bit uncomfortable, and some women experience sharp pelvic pain. The pain may subside once the catheter is in place. You may experience some cramping when the Foley catheter is in place.  This is normal.     GO TO THE MATERNITY ADMISSIONS UNIT FOR THE FOLLOWING:  Heavy vaginal bleeding  Rupture of membranes (fluid that wets your underwear)  Painful uterine contractions every 5 minutes or less  Severe abdominal discomfort  Decreased movement of the baby    [place] Pediatricians/Family Doctors:  [facility] [phone]            [facility] [phone]                 [facility] [phone] (usually not accepting new patients unless you have family there already, you are always welcome to call and ask)       [facility] Department [phone]       [facility] Pediatricians/Family Doctors:   [facility]: [phone]  [facility]: [phone]  [facility]:  [phone]  [facility] Doctors:   [facility]: [phone]   [facility]: [phone]  [facility] Family Doctors:  [facility]: [phone]

## 2020-11-01 NOTE — Telephone Encounter (Signed)
Preadmission screen  

## 2020-11-02 ENCOUNTER — Other Ambulatory Visit: Payer: Self-pay | Admitting: Advanced Practice Midwife

## 2020-11-02 ENCOUNTER — Telehealth (HOSPITAL_COMMUNITY): Payer: Self-pay | Admitting: *Deleted

## 2020-11-02 NOTE — Telephone Encounter (Signed)
Preadmission screen  

## 2020-11-03 ENCOUNTER — Other Ambulatory Visit: Payer: Self-pay | Admitting: Advanced Practice Midwife

## 2020-11-03 ENCOUNTER — Telehealth (HOSPITAL_COMMUNITY): Payer: Self-pay | Admitting: *Deleted

## 2020-11-03 ENCOUNTER — Encounter (HOSPITAL_COMMUNITY): Payer: Self-pay | Admitting: *Deleted

## 2020-11-03 NOTE — Telephone Encounter (Signed)
Preadmission screen  

## 2020-11-06 ENCOUNTER — Encounter (HOSPITAL_COMMUNITY): Payer: Self-pay | Admitting: Obstetrics and Gynecology

## 2020-11-06 ENCOUNTER — Inpatient Hospital Stay (HOSPITAL_COMMUNITY)
Admission: AD | Admit: 2020-11-06 | Discharge: 2020-11-10 | DRG: 788 | Disposition: A | Discharge status: Home / Self Care | Payer: BLUE CROSS/BLUE SHIELD | Attending: Obstetrics and Gynecology | Admitting: Obstetrics and Gynecology

## 2020-11-06 ENCOUNTER — Inpatient Hospital Stay (HOSPITAL_COMMUNITY): Payer: BLUE CROSS/BLUE SHIELD | Admitting: Anesthesiology

## 2020-11-06 ENCOUNTER — Other Ambulatory Visit: Payer: Self-pay

## 2020-11-06 DIAGNOSIS — Z20822 Contact with and (suspected) exposure to covid-19: Secondary | ICD-10-CM | POA: Diagnosis not present

## 2020-11-06 DIAGNOSIS — O9081 Anemia of the puerperium: Secondary | ICD-10-CM | POA: Diagnosis not present

## 2020-11-06 DIAGNOSIS — O26843 Uterine size-date discrepancy, third trimester: Secondary | ICD-10-CM

## 2020-11-06 DIAGNOSIS — O3663X0 Maternal care for excessive fetal growth, third trimester, not applicable or unspecified: Secondary | ICD-10-CM | POA: Diagnosis not present

## 2020-11-06 DIAGNOSIS — Z3A40 40 weeks gestation of pregnancy: Secondary | ICD-10-CM

## 2020-11-06 DIAGNOSIS — O4290 Premature rupture of membranes, unspecified as to length of time between rupture and onset of labor, unspecified weeks of gestation: Secondary | ICD-10-CM | POA: Diagnosis present

## 2020-11-06 DIAGNOSIS — O99214 Obesity complicating childbirth: Secondary | ICD-10-CM | POA: Diagnosis present

## 2020-11-06 DIAGNOSIS — O4212 Full-term premature rupture of membranes, onset of labor more than 24 hours following rupture: Secondary | ICD-10-CM | POA: Diagnosis not present

## 2020-11-06 DIAGNOSIS — O480 Post-term pregnancy: Principal | ICD-10-CM | POA: Diagnosis present

## 2020-11-06 DIAGNOSIS — Z3403 Encounter for supervision of normal first pregnancy, third trimester: Secondary | ICD-10-CM

## 2020-11-06 DIAGNOSIS — O26893 Other specified pregnancy related conditions, third trimester: Secondary | ICD-10-CM | POA: Diagnosis not present

## 2020-11-06 DIAGNOSIS — Z3400 Encounter for supervision of normal first pregnancy, unspecified trimester: Secondary | ICD-10-CM

## 2020-11-06 DIAGNOSIS — O3660X0 Maternal care for excessive fetal growth, unspecified trimester, not applicable or unspecified: Secondary | ICD-10-CM

## 2020-11-06 LAB — CBC
HCT: 40.1 % (ref 36.0–46.0)
Hemoglobin: 13.3 g/dL (ref 12.0–15.0)
MCH: 28.9 pg (ref 26.0–34.0)
MCHC: 33.2 g/dL (ref 30.0–36.0)
MCV: 87 fL (ref 80.0–100.0)
Platelets: 134 10*3/uL — ABNORMAL LOW (ref 150–400)
RBC: 4.61 MIL/uL (ref 3.87–5.11)
RDW: 14.6 % (ref 11.5–15.5)
WBC: 8.4 10*3/uL (ref 4.0–10.5)
nRBC: 0 % (ref 0.0–0.2)

## 2020-11-06 LAB — COMPREHENSIVE METABOLIC PANEL
ALT: 14 U/L (ref 0–44)
AST: 18 U/L (ref 15–41)
Albumin: 2.6 g/dL — ABNORMAL LOW (ref 3.5–5.0)
Alkaline Phosphatase: 155 U/L — ABNORMAL HIGH (ref 38–126)
Anion gap: 13 (ref 5–15)
BUN: 6 mg/dL (ref 6–20)
CO2: 21 mmol/L — ABNORMAL LOW (ref 22–32)
Calcium: 9.1 mg/dL (ref 8.9–10.3)
Chloride: 105 mmol/L (ref 98–111)
Creatinine, Ser: 0.63 mg/dL (ref 0.44–1.00)
GFR, Estimated: >60 mL/min (ref >60)
Glucose, Bld: 92 mg/dL (ref 70–99)
Potassium: 3.6 mmol/L (ref 3.5–5.1)
Sodium: 139 mmol/L (ref 135–145)
Total Bilirubin: 0.6 mg/dL (ref 0.3–1.2)
Total Protein: 6 g/dL — ABNORMAL LOW (ref 6.5–8.1)

## 2020-11-06 LAB — PROTEIN / CREATININE RATIO, URINE
Creatinine, Urine: 60.74 mg/dL
Protein Creatinine Ratio: 0.15 mg/mg{Cre} (ref 0.00–0.15)
Total Protein, Urine: 9 mg/dL

## 2020-11-06 LAB — RESP PANEL BY RT-PCR (FLU A&B, COVID) ARPGX2
Influenza A by PCR: NEGATIVE
Influenza B by PCR: NEGATIVE
SARS Coronavirus 2 by RT PCR: NEGATIVE

## 2020-11-06 LAB — POCT FERN TEST: POCT Fern Test: POSITIVE

## 2020-11-06 LAB — RPR: RPR Ser Ql: NONREACTIVE

## 2020-11-06 LAB — TYPE AND SCREEN
ABO/RH(D): A POS
Antibody Screen: NEGATIVE

## 2020-11-06 MED ORDER — LIDOCAINE HCL (PF) 1 % IJ SOLN
INTRAMUSCULAR | Status: DC | PRN
  Administered 2020-11-06: 2 mL via EPIDURAL
  Administered 2020-11-06: 3 mL via EPIDURAL
  Administered 2020-11-06: 5 mL via EPIDURAL

## 2020-11-06 MED ORDER — ZOLPIDEM TARTRATE 5 MG PO TABS: 5.0000 mg | ORAL_TABLET | Freq: Every evening | ORAL | Status: DC | PRN

## 2020-11-06 MED ORDER — LACTATED RINGERS IV SOLN
500.0000 mL | Freq: Once | INTRAVENOUS | Status: AC
  Administered 2020-11-06: 500 mL via INTRAVENOUS

## 2020-11-06 MED ORDER — SOD CITRATE-CITRIC ACID 500-334 MG/5ML PO SOLN
30.0000 mL | ORAL | Status: DC | PRN
  Filled 2020-11-06: qty 15

## 2020-11-06 MED ORDER — ONDANSETRON HCL 4 MG/2ML IJ SOLN
4.0000 mg | Freq: Four times a day (QID) | INTRAMUSCULAR | Status: DC | PRN
  Administered 2020-11-06: 4 mg via INTRAVENOUS
  Filled 2020-11-06: qty 2

## 2020-11-06 MED ORDER — OXYTOCIN-SODIUM CHLORIDE 30-0.9 UT/500ML-% IV SOLN: 2.5000 [IU]/h | INTRAVENOUS | Status: DC

## 2020-11-06 MED ORDER — FENTANYL-BUPIVACAINE-NACL 0.5-0.125-0.9 MG/250ML-% EP SOLN
12.0000 mL/h | EPIDURAL | Status: DC | PRN
  Filled 2020-11-06: qty 250

## 2020-11-06 MED ORDER — TERBUTALINE SULFATE 1 MG/ML IJ SOLN: 0.2500 mg | Freq: Once | INTRAMUSCULAR | Status: DC | PRN

## 2020-11-06 MED ORDER — PHENYLEPHRINE 40 MCG/ML (10ML) SYRINGE FOR IV PUSH (FOR BLOOD PRESSURE SUPPORT): 80.0000 ug | PREFILLED_SYRINGE | INTRAVENOUS | Status: DC | PRN

## 2020-11-06 MED ORDER — FENTANYL CITRATE (PF) 100 MCG/2ML IJ SOLN
100.0000 ug | INTRAMUSCULAR | Status: DC | PRN
  Administered 2020-11-06: 100 ug via INTRAVENOUS
  Filled 2020-11-06 (×2): qty 2

## 2020-11-06 MED ORDER — OXYCODONE-ACETAMINOPHEN 5-325 MG PO TABS: 1.0000 | ORAL_TABLET | ORAL | Status: DC | PRN

## 2020-11-06 MED ORDER — ACETAMINOPHEN 325 MG PO TABS: 650.0000 mg | ORAL_TABLET | ORAL | Status: DC | PRN

## 2020-11-06 MED ORDER — OXYTOCIN BOLUS FROM INFUSION: 333.0000 mL | Freq: Once | INTRAVENOUS | Status: DC

## 2020-11-06 MED ORDER — MISOPROSTOL 25 MCG QUARTER TABLET: 25.0000 ug | ORAL_TABLET | ORAL | Status: DC | PRN

## 2020-11-06 MED ORDER — SODIUM CHLORIDE (PF) 0.9 % IJ SOLN
INTRAMUSCULAR | Status: DC | PRN
  Administered 2020-11-06: 12 mL/h via EPIDURAL

## 2020-11-06 MED ORDER — LACTATED RINGERS IV SOLN
500.0000 mL | INTRAVENOUS | Status: DC | PRN
  Administered 2020-11-07: 1000 mL via INTRAVENOUS

## 2020-11-06 MED ORDER — LACTATED RINGERS IV SOLN: INTRAVENOUS | Status: DC

## 2020-11-06 MED ORDER — LIDOCAINE HCL (PF) 1 % IJ SOLN: 30.0000 mL | INTRAMUSCULAR | Status: DC | PRN

## 2020-11-06 MED ORDER — EPHEDRINE 5 MG/ML INJ: 10.0000 mg | INTRAVENOUS | Status: DC | PRN

## 2020-11-06 MED ORDER — DIPHENHYDRAMINE HCL 50 MG/ML IJ SOLN
12.5000 mg | INTRAMUSCULAR | Status: DC | PRN
  Administered 2020-11-06 – 2020-11-07 (×2): 12.5 mg via INTRAVENOUS
  Filled 2020-11-06: qty 1

## 2020-11-06 MED ORDER — OXYTOCIN-SODIUM CHLORIDE 30-0.9 UT/500ML-% IV SOLN
1.0000 m[IU]/min | INTRAVENOUS | Status: DC
  Administered 2020-11-06: 2 m[IU]/min via INTRAVENOUS
  Filled 2020-11-06: qty 500

## 2020-11-06 MED ORDER — OXYTOCIN-SODIUM CHLORIDE 30-0.9 UT/500ML-% IV SOLN
1.0000 m[IU]/min | INTRAVENOUS | Status: DC
  Administered 2020-11-07: 5 m[IU]/min via INTRAVENOUS

## 2020-11-06 MED ORDER — OXYCODONE-ACETAMINOPHEN 5-325 MG PO TABS: 2.0000 | ORAL_TABLET | ORAL | Status: DC | PRN

## 2020-11-06 NOTE — Progress Notes (Signed)
Christine Barajas is a 24 y.o. G1P0000 at [redacted]w[redacted]d.  Subjective: Much more uncomfortable w/ UC's  Objective: BP 117/64   Pulse 76   Temp 98.4 F (36.9 C) (Oral)   Resp 19   Ht 5' (1.524 m)   Wt 110.9 kg   LMP 01/26/2020   SpO2 100%   BMI 47.77 kg/m    FHT:  FHR: 145 bpm, variability: mod,  accelerations:  15x15,  decelerations:  None UC:   Q 2-5 minutes, moderate-strong Dilation: 4 Effacement (%): 70 Cervical Position: Anterior Station: -2 Presentation: Vertex Exam by:: Smith CNM  Labs: Results for orders placed or performed during the hospital encounter of 11/06/20 (from the past 24 hour(s))  POCT fern test     Status: Abnormal   Collection Time: 11/06/20  2:55 AM  Result Value Ref Range   POCT Fern Test Positive = ruptured amniotic membanes   Resp Panel by RT-PCR (Flu A&B, Covid) Nasopharyngeal Swab     Status: None   Collection Time: 11/06/20  3:14 AM   Specimen: Nasopharyngeal Swab; Nasopharyngeal(NP) swabs in vial transport medium  Result Value Ref Range   SARS Coronavirus 2 by RT PCR NEGATIVE NEGATIVE   Influenza A by PCR NEGATIVE NEGATIVE   Influenza B by PCR NEGATIVE NEGATIVE  CBC     Status: Abnormal   Collection Time: 11/06/20  3:14 AM  Result Value Ref Range   WBC 8.4 4.0 - 10.5 K/uL   RBC 4.61 3.87 - 5.11 MIL/uL   Hemoglobin 13.3 12.0 - 15.0 g/dL   HCT 25.9 36 - 46 %   MCV 87.0 80.0 - 100.0 fL   MCH 28.9 26.0 - 34.0 pg   MCHC 33.2 30.0 - 36.0 g/dL   RDW 56.3 87.5 - 64.3 %   Platelets 134 (L) 150 - 400 K/uL   nRBC 0.0 0.0 - 0.2 %  Comprehensive metabolic panel     Status: Abnormal   Collection Time: 11/06/20  3:14 AM  Result Value Ref Range   Sodium 139 135 - 145 mmol/L   Potassium 3.6 3.5 - 5.1 mmol/L   Chloride 105 98 - 111 mmol/L   CO2 21 (L) 22 - 32 mmol/L   Glucose, Bld 92 70 - 99 mg/dL   BUN 6 6 - 20 mg/dL   Creatinine, Ser 3.29 0.44 - 1.00 mg/dL   Calcium 9.1 8.9 - 51.8 mg/dL   Total Protein 6.0 (L) 6.5 - 8.1 g/dL   Albumin 2.6 (L) 3.5 -  5.0 g/dL   AST 18 15 - 41 U/L   ALT 14 0 - 44 U/L   Alkaline Phosphatase 155 (H) 38 - 126 U/L   Total Bilirubin 0.6 0.3 - 1.2 mg/dL   GFR, Estimated >84 >16 mL/min   Anion gap 13 5 - 15  Type and screen     Status: None   Collection Time: 11/06/20  3:15 AM  Result Value Ref Range   ABO/RH(D) A POS    Antibody Screen NEG    Sample Expiration      11/09/2020,2359 Performed at [facility], [address], [place], [place] [address]     Assessment / Plan: [redacted]w[redacted]d week IUP Labor: Early-Active. Progressing w/ w/out augmentation Fetal Wellbeing:  Category I Pain Control:  Fentanyl. Plans epidural later Anticipated MOD:  SVD  Katrinka Blazing [place], CNM 11/06/2020  9:29 AM

## 2020-11-06 NOTE — MAU Note (Signed)
PT SAYS  SHE WAS AN INDUCTION ON  Sunday.  SAYS AT 0144- UP TO B-ROOM - THEN TO BED - THEN  FLUID CAME OUT [facility]NC  WITH [facility]ILY [facility] - VE ON Monday-  1 CM.  DENIES HSV AND MRSA. GBS-NEG  FEELS SOME UC'S

## 2020-11-06 NOTE — Progress Notes (Signed)
Christine Barajas is a 24 y.o. G1P0000 at [redacted]w[redacted]d.  Subjective: Comfortable w/ epidural   Objective: BP (!) 79/49   Pulse (!) 106   Temp 98.4 F (36.9 C) (Oral)   Resp 18   Ht 5' (1.524 m)   Wt 110.9 kg   LMP 01/26/2020   SpO2 100%   BMI 47.77 kg/m    FHT:  FHR: 140 bpm, variability: mod,  accelerations:  15x15,  decelerations:  none UC:   Q 2-4 minutes, mod Dilation: 5 Effacement (%): 70 Cervical Position: Middle Station: -2 Presentation: Vertex Exam by:: Theone Murdoch MSF noted  Labs: NA  Assessment / Plan: [redacted]w[redacted]d week IUP Labor: Early, ROM x 12 hours w/ slow change. Will start low-doe pitocin Fetal Wellbeing:  Category I Pain Control:  Epidural  Anticipated MOD:  SVD  Katrinka Blazing [place], CNM 11/06/2020 1:28 PM

## 2020-11-06 NOTE — H&P (Addendum)
OBSTETRIC ADMISSION HISTORY AND PHYSICAL  Christine Barajas is a 24 y.o. female G1P0000 with IUP at 23w5dby LMP c/w 8 wk u/s presenting for SROM. Reports leakage of fluid that started around 0Antrevilletest down in MAU. She reports +FMs, no VB, no blurry vision, headaches or peripheral edema, and RUQ pain.  She plans on breast feeding. She request POPs for birth control.  [facility]  Dating: By LMP c/w 8 wk u/s --->  Estimated Date of Delivery: 11/01/20  Sono:   10/25/20@[redacted]w[redacted]d , CWD, normal anatomy, cephalic presentation, 31219X 84% EFW  Prenatal History/Complications: None  Past Medical History: Past Medical History:  Diagnosis Date   Family history of adverse reaction to anesthesia    father trouble waking up   Medical history non-contributory    Past Surgical History: Past Surgical History:  Procedure Laterality Date   WISDOM TOOTH EXTRACTION      Obstetrical History: OB History     Gravida  1   Para  0   Term  0   Preterm  0   AB  0   Living  0      SAB  0   TAB  0   Ectopic  0   Multiple  0   Live Births  0           Social History Social History   Socioeconomic History   Marital status: Single    Spouse name: Not on file   Number of children: Not on file   Years of education: Not on file   Highest education level: Not on file  Occupational History   Not on file  Tobacco Use   Smoking status: Never Smoker   Smokeless tobacco: Never Used  Vaping Use   Vaping Use: Never used  Substance and Sexual Activity   Alcohol use: Not Currently    Comment: not often   Drug use: Never   Sexual activity: Yes    Birth control/protection: None  Other Topics Concern   Not on file  Social History Narrative   Not on file   Social Determinants of Health   Financial Resource Strain: Low Risk    Difficulty of Paying Living Expenses: Not hard at all  Food Insecurity: No Food Insecurity   Worried About  RCharity fundraiserin the Last Year: Never true   [place] the Last Year: Never true  Transportation Needs: No Transportation Needs   Lack of Transportation (Medical): No   Lack of Transportation (Non-Medical): No  Physical Activity: Sufficiently Active   Days of Exercise per Week: 5 days   Minutes of Exercise per Session: 60 min  Stress: No Stress Concern Present   Feeling of Stress : Only a little  Social Connections: Moderately Integrated   Frequency of Communication with Friends and Family: Twice a week   Frequency of Social Gatherings with Friends and Family: Twice a week   Attends Religious Services: More than 4 times per year   Active Member of [employer] Organizations: No   Attends CMusic therapist Never   Marital Status: Married    Family History: Family History  Problem Relation Age of Onset   Heart attack Paternal Grandfather    Breast cancer Paternal Grandmother     Allergies: No Known Allergies  Medications Prior to Admission  Medication Sig Dispense Refill Last Dose   Prenatal Vit-DSS-Fe Cbn-FA (PRENATAL AD  PO) Take by mouth.   11/05/2020 at Unknown time   Review of Systems   All systems reviewed and negative except as stated in HPI  Blood pressure 140/81, pulse (!) 101, temperature 97.9 F (36.6 C), temperature source Oral, resp. rate 16, height 5' (1.524 m), weight 110.9 kg, last menstrual period 01/26/2020. General appearance: alert and no distress Lungs: no dyspnea Heart: regular rate  Abdomen: soft, non-tender; bowel sounds normal Pelvic: as stated below Extremities: Homans sign is negative, no sign of DVT Presentation: cephalic by u/s to the floor Fetal monitoringBaseline: 140 bpm, Variability: Good {> 6 bpm), Accelerations: Reactive and Decelerations: Absent Uterine activityFrequency: Every 3-4 minutes  Dilation: 2 Effacement (%): 80 Station: -1 Exam by:: Dr. Erin Sons  Prenatal labs: ABO, Rh: --/--/A POS (12/04  0315) Antibody: NEG (12/04 0315) Rubella: 1.92 (05/17 1643) RPR: Non Reactive (09/01 0831)  HBsAg: Negative (05/17 1643)  HIV: Non Reactive (09/01 0831)  GBS: Negative/-- (11/03 1130)  2 hr Glucola normal  Genetic screening  Normal female Anatomy US normal  Prenatal Transfer Tool  Maternal Diabetes: No Genetic Screening: Normal Maternal Ultrasounds/Referrals: Normal Fetal Ultrasounds or other Referrals:  None Maternal Substance Abuse:  No Significant Maternal Medications:  None Significant Maternal Lab Results: Group B Strep negative  Results for orders placed or performed during the hospital encounter of 11/06/20 (from the past 24 hour(s))  POCT fern test   Collection Time: 11/06/20  2:55 AM  Result Value Ref Range   POCT Fern Test Positive = ruptured amniotic membanes   CBC   Collection Time: 11/06/20  3:14 AM  Result Value Ref Range   WBC 8.4 4.0 - 10.5 K/uL   RBC 4.61 3.87 - 5.11 MIL/uL   Hemoglobin 13.3 12.0 - 15.0 g/dL   HCT 40.1 36 - 46 %   MCV 87.0 80.0 - 100.0 fL   MCH 28.9 26.0 - 34.0 pg   MCHC 33.2 30.0 - 36.0 g/dL   RDW 14.6 11.5 - 15.5 %   Platelets 134 (L) 150 - 400 K/uL   nRBC 0.0 0.0 - 0.2 %  Type and screen   Collection Time: 11/06/20  3:15 AM  Result Value Ref Range   ABO/RH(D) A POS    Antibody Screen NEG    Sample Expiration      11/09/2020,2359 Performed at [facility], [place] [address] [place], [place] [address]     Patient Active Problem List   Diagnosis Date Noted   Uterine size date discrepancy pregnancy, third trimester 10/06/2020   Supervision of normal first pregnancy 04/19/2020   Assessment/Plan:  Christine Barajas is a 24 y.o. G1P0000 at 4w5dhere for SOL s/p SROM @ 0145 on 11/06/20.  #Labor: Progressing well. Will continue expectant labor management and augment as clinically indicated.  #Rubella Non-immune: plan for MMR in postpartum period  #Pain: prn IV pain meds/epidural per pt request #FWB: Cat 1 strip #ID: [id number]  neg #MOF: breast #MOC: POPs #Circ:  Yes  CLayla Barter MD  11/06/2020, 4:40 AM  Attestation of Supervision of Student:  I confirm that I have verified the information documented in the  resident's  note and that I have also personally reperformed the history, physical exam and all medical decision making activities.  I have verified that all services and findings are accurately documented in this student's note; and I agree with management and plan as outlined in the documentation. I have also made any necessary editorial changes.  AGildardo Cranker  Astrid Drafts, [place] for [employer], [place] [employer] 11/06/2020 5:03 AM

## 2020-11-06 NOTE — Anesthesia Preprocedure Evaluation (Signed)
Anesthesia Evaluation  Patient identified by MRN, date of birth, ID band Patient awake    Reviewed: Allergy & Precautions, Patient's Chart, lab work & pertinent test results  Airway Mallampati: III  TM Distance: >3 FB     Dental   Pulmonary neg pulmonary ROS,    Pulmonary exam normal        Cardiovascular negative cardio ROS Normal cardiovascular exam     Neuro/Psych negative neurological ROS     GI/Hepatic negative GI ROS, Neg liver ROS,   Endo/Other  Morbid obesity  Renal/GU negative Renal ROS     Musculoskeletal   Abdominal   Peds  Hematology negative hematology ROS (+)   Anesthesia Other Findings   Reproductive/Obstetrics (+) Pregnancy                             Anesthesia Physical Anesthesia Plan  ASA: III  Anesthesia Plan: Epidural   Post-op Pain Management:    Induction:   PONV Risk Score and Plan: Treatment may vary due to age or medical condition  Airway Management Planned: Natural Airway  Additional Equipment:   Intra-op Plan:   Post-operative Plan:   Informed Consent: I have reviewed the patients History and Physical, chart, labs and discussed the procedure including the risks, benefits and alternatives for the proposed anesthesia with the patient or authorized representative who has indicated his/her understanding and acceptance.       Plan Discussed with:   Anesthesia Plan Comments:         Anesthesia Quick Evaluation

## 2020-11-06 NOTE — Progress Notes (Incomplete)
Christine Barajas is a 24 y.o. G1P0000 at [redacted]w[redacted]d.  Subjective: Increased abdominal and vaginal pressure w/ UC's  Objective: BP (!) 106/54   Pulse 95   Temp 98.5 F (36.9 C) (Oral)   Resp 18   Ht 5' (1.524 m)   Wt 110.9 kg   LMP 01/26/2020   SpO2 100%   BMI 47.77 kg/m    FHT:  FHR: 145 bpm, variability: mild,  accelerations:  15x15,  decelerations:   UC:  Every 2-4 minutes; mod-strong Dilation: 6.5, mild cervical swelling noted. Caput noted Effacement (%): 80 Cervical Position: Middle Station: 0,1+ Presentation: Vertex Exam by:: Lynnda Shields, MD    Labs: NA F/u on UPC  Assessment / Plan: [redacted]w[redacted]d week IUP Labor: Active, progressing slowly on pitocin. Currently on 37mU as of 2330, IUPC in at 2315 next check PRN. Fetal Wellbeing:  Category I-II, overall reassuring Pain Control:  Epidrual Anticipated MOD: Still hopeful for SVD, but some concern about slow progress.  Heavy MSF: Plan NICU team at delivery.   Terrill Mohr, Medical Student 11/06/2020 7:50 PM

## 2020-11-06 NOTE — Progress Notes (Signed)
Christine Barajas is a 24 y.o. G1P0000 at [redacted]w[redacted]d.  Subjective: Mild pressure w/ UC's/   Objective: BP 113/63   Pulse 86   Temp 97.9 F (36.6 C) (Oral)   Resp 18   Ht 5' (1.524 m)   Wt 110.9 kg   LMP 01/26/2020   SpO2 100%   BMI 47.77 kg/m    FHT:  FHR: 135 bpm, variability: mod,  accelerations:  15x15,  decelerations:  Fer variables UC:   Q  minutes, 1-4, mod-strong Dilation: 7, Caput noted Effacement (%): 90 Cervical Position: Middle Station: 0 Presentation: Vertex Exam by:: Dorathy Kinsman, CNM  Heavy MSF noted.   Labs: NA  Assessment / Plan: [redacted]w[redacted]d week IUP Labor: Active, progressing slowly on pitocin. Consider IUPC at next check PRN. Fetal Wellbeing:  Category I-II, overall reassuring Pain Control:  Epidrual Anticipated MOD: Still hopeful for SVD, but some concern about slow progress.  Heavy MSF: Plan NICU team at delivery.   Katrinka Blazing, [place], CNM 11/06/2020 7:50 PM

## 2020-11-06 NOTE — Anesthesia Procedure Notes (Signed)
Epidural Patient location during procedure: OB Start time: 11/06/2020 11:05 AM End time: 11/06/2020 11:12 AM  Staffing Anesthesiologist: Marcene Duos, MD Performed: anesthesiologist   Preanesthetic Checklist Completed: patient identified, IV checked, site marked, risks and benefits discussed, surgical consent, monitors and equipment checked, pre-op evaluation and timeout performed  Epidural Patient position: sitting Prep: DuraPrep and site prepped and draped Patient monitoring: continuous pulse ox and blood pressure Approach: midline Location: L3-L4 Injection technique: LOR air  Needle:  Needle type: Tuohy  Needle gauge: 17 G Needle length: 9 cm and 9 Needle insertion depth: 6 cm Catheter type: closed end flexible Catheter size: 19 Gauge Catheter at skin depth: 11 cm Test dose: negative  Assessment Events: blood not aspirated, injection not painful, no injection resistance, no paresthesia and negative IV test

## 2020-11-06 NOTE — Progress Notes (Addendum)
Christine Barajas is a 24 y.o. G1P0000 at [redacted]w[redacted]d.  Subjective: Increased abdominal and vaginal pressure w/ contractions.  Objective: BP (!) 106/54   Pulse 95   Temp 98.5 F (36.9 C) (Oral)   Resp 18   Ht 5' (1.524 m)   Wt 110.9 kg   LMP 01/26/2020   SpO2 100%   BMI 47.77 kg/m    FHT:  FHR: 145 bpm, variability: +accelerations: 15x15, decelerations absent   UC:  Every 2-4 minutes; mod-strong Dilation: 6.5, mild cervical swelling noted. Caput noted Effacement (%): 80 Cervical Position: Middle Station: 0,1+ Presentation: Vertex Exam by:: Lynnda Shields, MD    Labs: NA F/u on UPC  Assessment / Plan: [redacted]w[redacted]d week IUP Labor: Active, progressing slowly on pitocin. Currently on 30mU as of 2330, IUPC in at 2315, continue increasing pitocin based on adequate MVU. next check 0115. Fetal Wellbeing:  Category I Pain Control:  Epidrual Anticipated MOD: Still hopeful for SVD.  Heavy MSF: Plan NICU team at delivery.   Terrill Mohr, Medical Student 11/06/2020 7:50 PM  Attestation of Supervision of Student:  I confirm that I have verified the information documented in the medical student's note and that I have also personally reperformed the history, physical exam and all medical decision making activities.  I have verified that all services and findings are accurately documented in this student's note; and I agree with management and plan as outlined in the documentation. I have also made any necessary editorial changes.  Sheila Oats, MD Center for [facility], [facility] 11/07/2020 9:06 AM

## 2020-11-07 ENCOUNTER — Inpatient Hospital Stay (HOSPITAL_COMMUNITY)
Admission: AD | Admit: 2020-11-07 | Payer: BLUE CROSS/BLUE SHIELD | Source: Home / Self Care | Admitting: Obstetrics & Gynecology

## 2020-11-07 ENCOUNTER — Encounter (HOSPITAL_COMMUNITY)
Admission: AD | Disposition: A | Discharge status: Home / Self Care | Payer: Self-pay | Source: Home / Self Care | Attending: Obstetrics and Gynecology

## 2020-11-07 ENCOUNTER — Inpatient Hospital Stay (HOSPITAL_COMMUNITY): Payer: BLUE CROSS/BLUE SHIELD

## 2020-11-07 ENCOUNTER — Encounter (HOSPITAL_COMMUNITY): Payer: Self-pay | Admitting: Obstetrics and Gynecology

## 2020-11-07 DIAGNOSIS — O3663X0 Maternal care for excessive fetal growth, third trimester, not applicable or unspecified: Secondary | ICD-10-CM | POA: Diagnosis present

## 2020-11-07 DIAGNOSIS — Z3A40 40 weeks gestation of pregnancy: Secondary | ICD-10-CM

## 2020-11-07 DIAGNOSIS — O3660X0 Maternal care for excessive fetal growth, unspecified trimester, not applicable or unspecified: Secondary | ICD-10-CM

## 2020-11-07 DIAGNOSIS — O4212 Full-term premature rupture of membranes, onset of labor more than 24 hours following rupture: Secondary | ICD-10-CM

## 2020-11-07 SURGERY — Surgical Case
Anesthesia: Epidural | Wound class: Clean Contaminated

## 2020-11-07 MED ORDER — PHENYLEPHRINE HCL (PRESSORS) 10 MG/ML IV SOLN
INTRAVENOUS | Status: DC | PRN
  Administered 2020-11-07 (×2): 80 ug via INTRAVENOUS
  Administered 2020-11-07: 40 ug via INTRAVENOUS
  Administered 2020-11-07: 80 ug via INTRAVENOUS

## 2020-11-07 MED ORDER — DEXAMETHASONE SODIUM PHOSPHATE 4 MG/ML IJ SOLN
INTRAMUSCULAR | Status: AC
  Filled 2020-11-07: qty 1

## 2020-11-07 MED ORDER — NALBUPHINE HCL 10 MG/ML IJ SOLN: 5.0000 mg | INTRAMUSCULAR | Status: DC | PRN

## 2020-11-07 MED ORDER — IBUPROFEN 800 MG PO TABS
800.0000 mg | ORAL_TABLET | Freq: Three times a day (TID) | ORAL | Status: AC
  Administered 2020-11-07 – 2020-11-10 (×9): 800 mg via ORAL
  Filled 2020-11-07 (×10): qty 1

## 2020-11-07 MED ORDER — SENNOSIDES-DOCUSATE SODIUM 8.6-50 MG PO TABS
2.0000 | ORAL_TABLET | ORAL | Status: DC
  Administered 2020-11-08 – 2020-11-09 (×3): 2 via ORAL
  Filled 2020-11-07 (×3): qty 2

## 2020-11-07 MED ORDER — PROMETHAZINE HCL 25 MG/ML IJ SOLN
INTRAMUSCULAR | Status: DC | PRN
  Administered 2020-11-07: 6.25 mg via INTRAVENOUS

## 2020-11-07 MED ORDER — COCONUT OIL OIL
1.0000 "application " | TOPICAL_OIL | Status: DC | PRN
  Administered 2020-11-07: 1 via TOPICAL

## 2020-11-07 MED ORDER — KETOROLAC TROMETHAMINE 30 MG/ML IJ SOLN: 30.0000 mg | Freq: Once | INTRAMUSCULAR | Status: DC | PRN

## 2020-11-07 MED ORDER — LACTATED RINGERS AMNIOINFUSION: INTRAVENOUS | Status: DC

## 2020-11-07 MED ORDER — PROMETHAZINE HCL 25 MG/ML IJ SOLN: 6.2500 mg | INTRAMUSCULAR | Status: DC | PRN

## 2020-11-07 MED ORDER — DIBUCAINE (PERIANAL) 1 % EX OINT: 1.0000 "application " | TOPICAL_OINTMENT | CUTANEOUS | Status: DC | PRN

## 2020-11-07 MED ORDER — DIPHENHYDRAMINE HCL 25 MG PO CAPS: 25.0000 mg | ORAL_CAPSULE | ORAL | Status: DC | PRN

## 2020-11-07 MED ORDER — KETOROLAC TROMETHAMINE 30 MG/ML IJ SOLN: 30.0000 mg | Freq: Four times a day (QID) | INTRAMUSCULAR | Status: AC | PRN

## 2020-11-07 MED ORDER — MEPERIDINE HCL 25 MG/ML IJ SOLN: 6.2500 mg | INTRAMUSCULAR | Status: DC | PRN

## 2020-11-07 MED ORDER — DIPHENHYDRAMINE HCL 25 MG PO CAPS: 25.0000 mg | ORAL_CAPSULE | Freq: Four times a day (QID) | ORAL | Status: DC | PRN

## 2020-11-07 MED ORDER — MENTHOL 3 MG MT LOZG: 1.0000 | LOZENGE | OROMUCOSAL | Status: DC | PRN

## 2020-11-07 MED ORDER — LACTATED RINGERS IV SOLN: INTRAVENOUS | Status: DC | PRN

## 2020-11-07 MED ORDER — CEFAZOLIN SODIUM-DEXTROSE 2-4 GM/100ML-% IV SOLN
2.0000 g | Freq: Once | INTRAVENOUS | Status: AC
  Administered 2020-11-07: 2 g via INTRAVENOUS

## 2020-11-07 MED ORDER — ONDANSETRON HCL 4 MG/2ML IJ SOLN
INTRAMUSCULAR | Status: AC
  Filled 2020-11-07: qty 2

## 2020-11-07 MED ORDER — MEPERIDINE HCL 25 MG/ML IJ SOLN
INTRAMUSCULAR | Status: DC | PRN
  Administered 2020-11-07 (×2): 12.5 mg via INTRAVENOUS

## 2020-11-07 MED ORDER — OXYCODONE HCL 5 MG/5ML PO SOLN: 5.0000 mg | Freq: Once | ORAL | Status: DC | PRN

## 2020-11-07 MED ORDER — GABAPENTIN 100 MG PO CAPS
100.0000 mg | ORAL_CAPSULE | Freq: Two times a day (BID) | ORAL | Status: DC
  Administered 2020-11-07 – 2020-11-10 (×7): 100 mg via ORAL
  Filled 2020-11-07 (×7): qty 1

## 2020-11-07 MED ORDER — DIPHENHYDRAMINE HCL 50 MG/ML IJ SOLN: 12.5000 mg | INTRAMUSCULAR | Status: DC | PRN

## 2020-11-07 MED ORDER — OXYTOCIN-SODIUM CHLORIDE 30-0.9 UT/500ML-% IV SOLN
INTRAVENOUS | Status: DC | PRN
  Administered 2020-11-07: 400 mL via INTRAVENOUS

## 2020-11-07 MED ORDER — SODIUM CHLORIDE 0.9 % IV SOLN
500.0000 mg | INTRAVENOUS | Status: AC
  Administered 2020-11-07: 500 mg via INTRAVENOUS

## 2020-11-07 MED ORDER — SOD CITRATE-CITRIC ACID 500-334 MG/5ML PO SOLN
30.0000 mL | ORAL | Status: AC
  Administered 2020-11-07: 30 mL via ORAL

## 2020-11-07 MED ORDER — SODIUM CHLORIDE 0.9 % IR SOLN
Status: DC | PRN
  Administered 2020-11-07: 1000 mL

## 2020-11-07 MED ORDER — KETOROLAC TROMETHAMINE 30 MG/ML IJ SOLN
30.0000 mg | Freq: Four times a day (QID) | INTRAMUSCULAR | Status: AC | PRN
  Administered 2020-11-07: 30 mg via INTRAVENOUS

## 2020-11-07 MED ORDER — WITCH HAZEL-GLYCERIN EX PADS: 1.0000 "application " | MEDICATED_PAD | CUTANEOUS | Status: DC | PRN

## 2020-11-07 MED ORDER — ONDANSETRON HCL 4 MG/2ML IJ SOLN: 4.0000 mg | Freq: Three times a day (TID) | INTRAMUSCULAR | Status: DC | PRN

## 2020-11-07 MED ORDER — OXYTOCIN-SODIUM CHLORIDE 30-0.9 UT/500ML-% IV SOLN: 2.5000 [IU]/h | INTRAVENOUS | Status: AC

## 2020-11-07 MED ORDER — OXYTOCIN-SODIUM CHLORIDE 30-0.9 UT/500ML-% IV SOLN
INTRAVENOUS | Status: AC
  Filled 2020-11-07: qty 500

## 2020-11-07 MED ORDER — PRENATAL MULTIVITAMIN CH
1.0000 | ORAL_TABLET | Freq: Every day | ORAL | Status: DC
  Administered 2020-11-08 – 2020-11-09 (×2): 1 via ORAL
  Filled 2020-11-07 (×2): qty 1

## 2020-11-07 MED ORDER — NALBUPHINE HCL 10 MG/ML IJ SOLN: 5.0000 mg | Freq: Once | INTRAMUSCULAR | Status: DC | PRN

## 2020-11-07 MED ORDER — MORPHINE SULFATE (PF) 0.5 MG/ML IJ SOLN
INTRAMUSCULAR | Status: DC | PRN
  Administered 2020-11-07: 3 mg via EPIDURAL

## 2020-11-07 MED ORDER — ENOXAPARIN SODIUM 60 MG/0.6ML ~~LOC~~ SOLN
0.5000 mg/kg | SUBCUTANEOUS | Status: DC
  Administered 2020-11-08 – 2020-11-10 (×3): 55 mg via SUBCUTANEOUS
  Filled 2020-11-07 (×3): qty 0.6

## 2020-11-07 MED ORDER — SCOPOLAMINE 1 MG/3DAYS TD PT72
MEDICATED_PATCH | TRANSDERMAL | Status: DC | PRN
  Administered 2020-11-07: 1 via TRANSDERMAL

## 2020-11-07 MED ORDER — DEXAMETHASONE SODIUM PHOSPHATE 4 MG/ML IJ SOLN
INTRAMUSCULAR | Status: DC | PRN
  Administered 2020-11-07: 4 mg via INTRAVENOUS

## 2020-11-07 MED ORDER — ONDANSETRON HCL 4 MG/2ML IJ SOLN
INTRAMUSCULAR | Status: DC | PRN
  Administered 2020-11-07: 4 mg via INTRAVENOUS

## 2020-11-07 MED ORDER — KETOROLAC TROMETHAMINE 30 MG/ML IJ SOLN
INTRAMUSCULAR | Status: AC
  Filled 2020-11-07: qty 1

## 2020-11-07 MED ORDER — SODIUM CHLORIDE 0.9 % IV SOLN
INTRAVENOUS | Status: AC
  Filled 2020-11-07: qty 500

## 2020-11-07 MED ORDER — SCOPOLAMINE 1 MG/3DAYS TD PT72: 1.0000 | MEDICATED_PATCH | Freq: Once | TRANSDERMAL | Status: DC

## 2020-11-07 MED ORDER — NALOXONE HCL 4 MG/10ML IJ SOLN
1.0000 ug/kg/h | INTRAVENOUS | Status: DC | PRN
  Filled 2020-11-07: qty 5

## 2020-11-07 MED ORDER — OXYCODONE HCL 5 MG PO TABS: 5.0000 mg | ORAL_TABLET | Freq: Once | ORAL | Status: DC | PRN

## 2020-11-07 MED ORDER — SODIUM CHLORIDE 0.9% FLUSH: 3.0000 mL | INTRAVENOUS | Status: DC | PRN

## 2020-11-07 MED ORDER — SIMETHICONE 80 MG PO CHEW
80.0000 mg | CHEWABLE_TABLET | Freq: Three times a day (TID) | ORAL | Status: DC
  Administered 2020-11-07 – 2020-11-10 (×10): 80 mg via ORAL
  Filled 2020-11-07 (×10): qty 1

## 2020-11-07 MED ORDER — NALOXONE HCL 0.4 MG/ML IJ SOLN: 0.4000 mg | INTRAMUSCULAR | Status: DC | PRN

## 2020-11-07 MED ORDER — OXYCODONE HCL 5 MG PO TABS
5.0000 mg | ORAL_TABLET | ORAL | Status: DC | PRN
  Administered 2020-11-08: 5 mg via ORAL
  Filled 2020-11-07: qty 1

## 2020-11-07 MED ORDER — POLYETHYLENE GLYCOL 3350 17 G PO PACK
17.0000 g | PACK | Freq: Every day | ORAL | Status: DC
  Administered 2020-11-09: 17 g via ORAL
  Filled 2020-11-07 (×2): qty 1

## 2020-11-07 MED ORDER — TRANEXAMIC ACID-NACL 1000-0.7 MG/100ML-% IV SOLN
INTRAVENOUS | Status: DC | PRN
  Administered 2020-11-07: 1000 mg via INTRAVENOUS

## 2020-11-07 MED ORDER — HYDROMORPHONE HCL 1 MG/ML IJ SOLN: 0.2500 mg | INTRAMUSCULAR | Status: DC | PRN

## 2020-11-07 MED ORDER — ACETAMINOPHEN 500 MG PO TABS
1000.0000 mg | ORAL_TABLET | Freq: Four times a day (QID) | ORAL | Status: AC
  Administered 2020-11-07 – 2020-11-08 (×3): 1000 mg via ORAL
  Filled 2020-11-07 (×3): qty 2

## 2020-11-07 MED ORDER — MORPHINE SULFATE (PF) 0.5 MG/ML IJ SOLN
INTRAMUSCULAR | Status: AC
  Filled 2020-11-07: qty 10

## 2020-11-07 MED ORDER — LIDOCAINE-EPINEPHRINE (PF) 2 %-1:200000 IJ SOLN
INTRAMUSCULAR | Status: DC | PRN
  Administered 2020-11-07: 10 mL via EPIDURAL

## 2020-11-07 MED ORDER — TETANUS-DIPHTH-ACELL PERTUSSIS 5-2.5-18.5 LF-MCG/0.5 IM SUSY: 0.5000 mL | PREFILLED_SYRINGE | Freq: Once | INTRAMUSCULAR | Status: DC

## 2020-11-07 SURGICAL SUPPLY — 48 items
APL SKNCLS STERI-STRIP NONHPOA (GAUZE/BANDAGES/DRESSINGS) ×2
BENZOIN TINCTURE PRP APPL 2/3 (GAUZE/BANDAGES/DRESSINGS) ×3 IMPLANT
CANISTER SUCT 3000ML PPV (MISCELLANEOUS) ×2 IMPLANT
CANISTER WOUND CARE 500ML ATS (WOUND CARE) ×1 IMPLANT
CELL SAVER LIPIGURD (MISCELLANEOUS) ×1 IMPLANT
CHLORAPREP W/TINT 26ML (MISCELLANEOUS) ×2 IMPLANT
DEVICE RETRIEVAL ALEXIS 14 (MISCELLANEOUS) IMPLANT
DRESSING PREVENA PLUS CUSTOM (GAUZE/BANDAGES/DRESSINGS) IMPLANT
DRSG OPSITE POSTOP 4X10 (GAUZE/BANDAGES/DRESSINGS) ×2 IMPLANT
DRSG PREVENA PLUS CUSTOM (GAUZE/BANDAGES/DRESSINGS) ×2
ELECT REM PT RETURN 9FT ADLT (ELECTROSURGICAL) ×2
ELECTRODE REM PT RTRN 9FT ADLT (ELECTROSURGICAL) ×1 IMPLANT
EXTRACTOR VACUUM KIWI (MISCELLANEOUS) ×2 IMPLANT
EXTRT SYSTEM ALEXIS 14CM (MISCELLANEOUS) ×2
GLOVE BIOGEL PI IND STRL 7.0 (GLOVE) ×2 IMPLANT
GLOVE BIOGEL PI IND STRL 7.5 (GLOVE) ×1 IMPLANT
GLOVE BIOGEL PI INDICATOR 7.0 (GLOVE) ×2
GLOVE BIOGEL PI INDICATOR 7.5 (GLOVE) ×1
GLOVE SKINSENSE NS SZ7.0 (GLOVE) ×1
GLOVE SKINSENSE STRL SZ7.0 (GLOVE) ×1 IMPLANT
GOWN STRL REUS W/ TWL LRG LVL3 (GOWN DISPOSABLE) ×2 IMPLANT
GOWN STRL REUS W/ TWL XL LVL3 (GOWN DISPOSABLE) ×1 IMPLANT
GOWN STRL REUS W/TWL LRG LVL3 (GOWN DISPOSABLE) ×4
GOWN STRL REUS W/TWL XL LVL3 (GOWN DISPOSABLE) ×2
HOVERMATT SINGLE USE (MISCELLANEOUS) ×1 IMPLANT
NS IRRIG 1000ML POUR BTL (IV SOLUTION) ×2 IMPLANT
PACK C SECTION WH (CUSTOM PROCEDURE TRAY) ×2 IMPLANT
PAD ABD 7.5X8 STRL (GAUZE/BANDAGES/DRESSINGS) ×2 IMPLANT
PAD OB MATERNITY 4.3X12.25 (PERSONAL CARE ITEMS) ×2 IMPLANT
PAD PREP 24X48 CUFFED NSTRL (MISCELLANEOUS) ×2 IMPLANT
PENCIL SMOKE EVAC W/HOLSTER (ELECTROSURGICAL) ×2 IMPLANT
RETRACTOR TRAXI PANNICULUS (MISCELLANEOUS) IMPLANT
RETRACTOR WND ALEXIS 25 LRG (MISCELLANEOUS) IMPLANT
RTRCTR WOUND ALEXIS 25CM LRG (MISCELLANEOUS) ×2
STRIP CLOSURE SKIN 1/2X4 (GAUZE/BANDAGES/DRESSINGS) ×2 IMPLANT
SUT CHROMIC 1 CTX 36 (SUTURE) ×1 IMPLANT
SUT MNCRL 0 VIOLET CTX 36 (SUTURE) ×2 IMPLANT
SUT MON AB 4-0 PS1 27 (SUTURE) ×2 IMPLANT
SUT MONOCRYL 0 CTX 36 (SUTURE) ×4
SUT PLAIN 2 0 XLH (SUTURE) ×2 IMPLANT
SUT VIC AB 0 CT1 36 (SUTURE) ×4 IMPLANT
SUT VIC AB 2-0 CT1 27 (SUTURE) ×2
SUT VIC AB 2-0 CT1 TAPERPNT 27 (SUTURE) IMPLANT
SUT VIC AB 3-0 CT1 27 (SUTURE) ×2
SUT VIC AB 3-0 CT1 TAPERPNT 27 (SUTURE) ×1 IMPLANT
TOWEL OR 17X24 6PK STRL BLUE (TOWEL DISPOSABLE) ×4 IMPLANT
TRAXI PANNICULUS RETRACTOR (MISCELLANEOUS) ×1
WATER STERILE IRR 1000ML POUR (IV SOLUTION) ×2 IMPLANT

## 2020-11-07 NOTE — Anesthesia Postprocedure Evaluation (Signed)
Anesthesia Post Note  Patient: Christine Barajas  Procedure(s) Performed: CESAREAN SECTION (N/A )     Patient location during evaluation: PACU Anesthesia Type: Epidural Level of consciousness: awake and alert and oriented Pain management: pain level controlled Vital Signs Assessment: post-procedure vital signs reviewed and stable Respiratory status: spontaneous breathing, nonlabored ventilation and respiratory function stable Cardiovascular status: blood pressure returned to baseline and stable Postop Assessment: no headache, no backache, patient able to bend at knees and epidural receding Anesthetic complications: no   No complications documented.  Last Vitals:  Vitals:   11/07/20 1243 11/07/20 1624  BP: 103/78 92/71  Pulse: 88 81  Resp:  16  Temp:  36.9 C  SpO2:  98%    Last Pain:  Vitals:   11/07/20 1624  TempSrc: Oral  PainSc:    Pain Goal: Patients Stated Pain Goal: 3 (11/07/20 1620)                 Lannie Fields

## 2020-11-07 NOTE — Transfer of Care (Signed)
Immediate Anesthesia Transfer of Care Note  Patient: Christine Barajas  Procedure(s) Performed: CESAREAN SECTION (N/A )  Patient Location: PACU  Anesthesia Type:Epidural  Level of Consciousness: awake, alert  and oriented  Airway & Oxygen Therapy: Patient Spontanous Breathing  Post-op Assessment: Report given to RN and Post -op Vital signs reviewed and stable  Post vital signs: Reviewed and stable  Last Vitals:  Vitals Value Taken Time  BP 134/85 11/07/20 0700  Temp    Pulse 76 11/07/20 0706  Resp 11 11/07/20 0706  SpO2 100 % 11/07/20 0706  Vitals shown include unvalidated device data.  Last Pain:  Vitals:   11/07/20 0225  TempSrc:   PainSc: 4          Complications: No complications documented.

## 2020-11-07 NOTE — Discharge Summary (Signed)
Postpartum Discharge Summary     Patient Name: Christine Barajas DOB: [birth year] MRN: [id number]  Date of admission: 11/06/2020 Delivery date:11/07/2020  Delivering provider: Aletha Halim  Date of discharge: 11/10/2020  Admitting diagnosis: Post term pregnancy over 40 weeks [O48.0] Intrauterine pregnancy: [redacted]w[redacted]d    Secondary diagnosis:  Principal Problem:   Cesarean delivery delivered Active Problems:   Supervision of normal first pregnancy   Macrosomia of fetus affecting management of mother in third trimester   Postpartum anemia  Additional problems: as noted above  Discharge diagnosis: Primary Cesarean (arrest of dilation at 6cm, LGA infant)                      Post partum procedures:none Augmentation: AROM and Pitocin Complications: None  Hospital course: Induction of Labor With Cesarean Section   24y.o. yo G1P1001 at 466w6das admitted to the hospital 11/06/2020 for management of latent labor s/p SROM at 01Beasonn 11/06/20. Patient had a labor course significant for arrest of dilation at 6-7cm in setting of LGA infant. The patient went for cesarean section due to Arrest of Dilation. Delivery details are as follows: Membrane Rupture Time/Date: 1:40 AM ,11/06/2020   Delivery Method:C-Section, Low Transverse  Details of operation can be found in separate operative note.  Patient had an uncomplicated postpartum course. She is ambulating, tolerating a regular diet, passing flatus, and urinating well. Had significant BLE edema, Lasix prescribed x 5 days with Kdur.  Patient is discharged home in stable condition on 11/10/20.      Newborn Data: Birth date:11/07/2020  Birth time:5:30 AM  Gender:Female  Living status:Living  Apgars:5 ,7  Weight:4220 g                                 Magnesium Sulfate received: No BMZ received: No Rhophylac:N/A MMR:N/A T-DaP:Given prenatally Flu: Declined Transfusion:No  Physical exam  Vitals:   11/09/20 0737 11/09/20 1343 11/09/20 2048  11/10/20 0610  BP: (!) 105/55 137/86 137/84 126/78  Pulse: 67 87 92 77  Resp: 16 18 18 18   Temp: 97.8 F (36.6 C) 98.1 F (36.7 C) 98 F (36.7 C) 97.7 F (36.5 C)  TempSrc: Oral Oral Oral Oral  SpO2: 100% 100% 100% 100%  Weight:      Height:       General: alert, cooperative and no distress Lochia: appropriate Uterine Fundus: firm Incision: Prevena Dressing is clean, dry, and intact DVT Evaluation: No evidence of DVT seen on physical exam. Negative Homan's sign. No cords or calf tenderness. 2-3+ BLE edema.  Labs: Lab Results  Component Value Date   WBC 10.1 11/08/2020   HGB 9.2 (L) 11/08/2020   HCT 27.3 (L) 11/08/2020   MCV 87.8 11/08/2020   PLT 108 (L) 11/08/2020   CMP Latest Ref Rng & Units 11/06/2020  Glucose 70 - 99 mg/dL 92  BUN 6 - 20 mg/dL 6  Creatinine 0.44 - 1.00 mg/dL 0.63  Sodium 135 - 145 mmol/L 139  Potassium 3.5 - 5.1 mmol/L 3.6  Chloride 98 - 111 mmol/L 105  CO2 22 - 32 mmol/L 21(L)  Calcium 8.9 - 10.3 mg/dL 9.1  Total Protein 6.5 - 8.1 g/dL 6.0(L)  Total Bilirubin 0.3 - 1.2 mg/dL 0.6  Alkaline Phos 38 - 126 U/L 155(H)  AST 15 - 41 U/L 18  ALT 0 - 44 U/L 14   Edinburgh Score: EdFlavia Shipperostnatal  Depression Scale Screening Tool 11/07/2020  I have been able to laugh and see the funny side of things. 0  I have looked forward with enjoyment to things. 0  I have blamed myself unnecessarily when things went wrong. 1  I have been anxious or worried for no good reason. 0  I have felt scared or panicky for no good reason. 0  Things have been getting on top of me. 1  I have been so unhappy that I have had difficulty sleeping. 1  I have felt sad or miserable. 0  I have been so unhappy that I have been crying. 1  The thought of harming myself has occurred to me. 0  Edinburgh Postnatal Depression Scale Total 4     After visit meds:  Allergies as of 11/10/2020   No Known Allergies     Medication List    TAKE these medications   ferrous sulfate 325  (65 FE) MG tablet Commonly known as: FerrouSul Take 1 tablet (325 mg total) by mouth every other day.   furosemide 20 MG tablet Commonly known as: LASIX Take 1 tablet (20 mg total) by mouth daily.   ibuprofen 800 MG tablet Commonly known as: ADVIL Take 1 tablet (800 mg total) by mouth every 8 (eight) hours.   oxyCODONE 5 MG immediate release tablet Commonly known as: Oxy IR/ROXICODONE Take 1-2 tablets (5-10 mg total) by mouth every 4 (four) hours as needed for moderate pain.   potassium chloride SA 20 MEQ tablet Commonly known as: KLOR-CON Take 2 tablets (40 mEq total) by mouth daily for 5 days.   PRENATAL AD PO Take 1 tablet by mouth daily.   senna-docusate 8.6-50 MG tablet Commonly known as: Senokot-S Take 2 tablets by mouth at bedtime as needed for mild constipation.            Discharge Care Instructions  (From admission, onward)         Start     Ordered   11/09/20 0000  Discharge wound care:       Comments: As per discharge handout and nursing instructions   11/09/20 2202           Discharge home in stable condition Infant Feeding: Breast Infant Disposition:home with mother Discharge instruction: per After Visit Summary and Postpartum booklet. Activity: Advance as tolerated. Pelvic rest for 6 weeks.  Diet: routine diet Future Appointments: Future Appointments  Date Time Provider [place]  11/15/2020 11:40 AM Florian Buff, MD [facility]  12/13/2020  1:30 PM Florian Buff, MD [facility]   Follow up Visit: Message sent to [facility] on 11/07/20.  Please schedule this patient for a In person postpartum visit in 4 weeks with the following provider: Any provider. Additional Postpartum F/U:Incision check 1 week and BP check 1 week  Low risk pregnancy complicated by: mild range BPs on admission to L&D, LGA infant (4220g at birth) Delivery mode:  C-Section, Low Transverse  secondary to arrest of dilation Anticipated Birth Control:   POPs   11/10/2020 Verita Schneiders, MD

## 2020-11-07 NOTE — Lactation Note (Signed)
This note was copied from a baby's chart. Lactation Consultation Note  Patient Name: Christine Barajas [id number] Date: 11/07/2020 Reason for consult: Initial assessment;Primapara;1st time breastfeeding;NICU baby;Term C/S- probable transient tachypnea   LC in to visit with P1 Mom and FOB of term infant in the NICU.  Baby 4 hrs old and needing respiratory support, probably TTN.  Mom interested in starting to establish her milk supply.  Mom reports + breast changes in early pregnancy.  Assisted Mom with breast massage and hand expression, colostrum drops easily expressed.  Mom return demonstrated this, colostrum containers provided.  Parents to take colostrum to NICU next time they go to visit with baby "Christine Barajas".  DEBP set up and assisted Mom with first pumping on initiation setting.  24 mm flanges appear to be the correct flange size currently.  Mom instructed to pump/hand express a goal of 8 times per 24 hrs.  Mom aware of importance of STS with baby when allowed.  LC taught how to disassemble pump parts, wash, rinse and air dry in separate bin provided.  FOB acknowledged importance and will be assisting with this.  Mom has a [employer] (insurance pump) at home.   Breastfeeding basics reviewed.  Mom given a lactation brochure.  Mom aware of IP and OP lactation support available to her.     Interventions Interventions: Breast feeding basics reviewed;Skin to skin;Breast massage;Hand express;DEBP  Lactation Tools Discussed/Used Tools: Pump;Flanges Flange Size: 24 Breast pump type: Double-Electric Breast Pump WIC Program: No Pump Review: Setup, frequency, and cleaning;Milk Storage Initiated by:: Erby Pian RN IBCLC Date initiated:: 11/07/20   Consult Status Consult Status: Follow-up Date: 11/08/20 Follow-up type: In-patient    [name] 11/07/2020, 10:02 AM

## 2020-11-07 NOTE — Op Note (Signed)
Operative Note   SURGERY DATE: 11/07/2020  PRE-OP DIAGNOSIS:  *Pregnancy at 40/6 *Arrest dilation at 6-7cm after admission for SROM *Meconium *BMI 50   POST-OP DIAGNOSIS: Same. Macrosomia. Delivered   PROCEDURE: primary low transverse cesarean section via pfannenstiel skin incision with double layer uterine closure with Prevena incision device placement  SURGEON: Surgeon(s) and Role:    * Surry Bing, MD - Primary  ASSISTANT:    Sheila Oats, MD - Fellow  ANESTHESIA: epidural  ESTIMATED BLOOD LOSS:  DRAINS: UOP via indwelling foley  TOTAL IV FLUIDS: crystalloid  VTE PROPHYLAXIS: SCDs to bilateral lower extremities  ANTIBIOTICS: Two grams of Cefazolin were given., within 1 hour of skin incision and azithromycin 500mg  IV x 1 given intraoperatively  SPECIMENS: none  COMPLICATIONS: none  FINDINGS: No intra-abdominal adhesions were noted. Grossly normal uterus, tubes and ovaries. Moderate meconium amniotic fluid, cephalic, large caput, occiput anterior, female infant, weight 4220gm, APGARs 5/7/9, intact placenta.  PROCEDURE IN DETAIL: The patient was taken to the operating room where anesthesia was administered and normal fetal heart tones were confirmed. She was then prepped and draped in the normal fashion in the dorsal supine position with a leftward tilt; traxi pannus retractor used.  After a time out was performed, a pfannensteil skin incision was made with the scalpel and carried through to the underlying layer of fascia. The fascia was then incised at the midline and this incision was extended laterally with the mayo scissors. Attention was turned to the superior aspect of the fascial incision which was grasped with the kocher clamps x 2, tented up and the rectus muscles were dissected off with the scalpel. In a similar fashion the inferior aspect of the fascial incision was grasped with the kocher clamps, tented up and the rectus muscles dissected  off with the mayo scissors. The rectus muscles were then separated in the midline and the peritoneum was entered bluntly. The bladder blade was inserted and the vesicouterine peritoneum was identified, tented up and entered with the metzenbaum scissors. This incision was extended laterally and the bladder flap was created digitally. The bladder blade was reinserted.  A low transverse hysterotomy was made with the scalpel until the endometrial cavity was breached and the amniotic sac ruptured, yielding moderate meconium stained amniotic fluid. This incision was extended bluntly and the infant's head, shoulders and body were delivered atraumatically.The cord was clamped x 2 and cut, and the infant was handed to the awaiting pediatricians, after delayed cord clamping was not done.  The placenta was then gradually expressed from the uterus and then the uterus was exteriorized and cleared of all clots and debris. The hysterotomy was repaired with a running suture of 1-0 monocryl. A second imbricating layer of 1-0 monocryl suture was then placed. Several figure-of-eight sutures of monocryl and #1 chromic were added to achieve excellent hemostasis.   The uterus and adnexa were then returned to the abdomen, and the hysterotomy and all operative sites were reinspected and excellent hemostasis was noted after irrigation and suction of the abdomen with warm saline.  The peritoneum was closed with a running stitch of 3-0 Vicryl. The fascia was reapproximated with 0 Vicryl in a simple running fashion bilaterally. The subcutaneous layer was then reapproximated with interrupted sutures of 2-0 plain gut, and the skin was then closed with 4-0 monocryl, in a subcuticular fashion. A Prevena was then placed over the inicison  The patient  tolerated the procedure well. Sponge, lap, needle, and  instrument counts were correct x 2. The patient was transferred to the recovery room awake, alert and breathing independently in stable  condition.  Cornelia Copa MD Attending Center for [facility])

## 2020-11-07 NOTE — Progress Notes (Addendum)
Provider notified that patients abdomen was firm during rest periods between contractions and that the patient stated that she was feeling a stabbing pain surrounding the upper abdomen that was continuous and not related to contractions. Provider stated to continue to monitor abdomen and the FHR.

## 2020-11-07 NOTE — Progress Notes (Signed)
Christine Barajas is a 24 y.o. G1P0000 at [redacted]w[redacted]d.  Subjective: Pt endorses understanding of need for Cesarean given lack of cervical change.  Objective: BP 124/72   Pulse 83   Temp 99.2 F (37.3 C)   Resp 18   Ht 5' (1.524 m)   Wt 110.9 kg   LMP 01/26/2020   SpO2 100%   BMI 47.77 kg/m    FHT:  FHR: 150 bpm, variability: moderate, +accelerations: 15x15, recurrent variable decels  UC:  Every 3-4 minutes Dilation: 7, mild cervical swelling noted. Caput noted Effacement (%): 80 Cervical Position: Middle Station: 0,1+ Presentation: Vertex Exam by:: Lynnda Shields, MD  Assessment / Plan: [redacted]w[redacted]d week IUP, now with arrest of dilation since 1615 on 12/4. Plan for primary Cesarean.  Labor: Active labor but lack of cervical change since 1615 on 12/4 despite periods of adequate contractions. Some difficulty with up-titration of pitocin given fetal intolerance. Bedside ultrasound shows asynclitic fetal presentation. Given arrest of dilation, pt consented for primary Cesarean as noted below.  The risks of cesarean section were discussed with the patient including but were not limited to: bleeding which may require transfusion or reoperation; infection which may require antibiotics; injury to bowel, bladder, ureters or other surrounding organs; injury to the fetus; need for additional procedures including hysterectomy in the event of a life-threatening hemorrhage; placental abnormalities wth subsequent pregnancies, incisional problems, thromboembolic phenomenon and other postoperative/anesthesia complications.  The patient concurred with the proposed plan, giving informed written consent for the procedure.  Patient has been NPO since yesterday morning; she will remain NPO for procedure. Anesthesia and OR aware.  Preoperative prophylactic antibiotics (azithromycin, ancef) and SCDs ordered on call to the OR.  To OR when ready.  Fetal Wellbeing:  Category 2 strip given recurrent variable decels. Pain  Control:  Epidural in place Anticipated MOD: primary Cesarean as noted above Heavy MSF: Plan NICU team at delivery.   Sheila Oats, MD 11/07/2020 7:50 PM

## 2020-11-07 NOTE — Discharge Instructions (Signed)

## 2020-11-07 NOTE — Progress Notes (Signed)
OB Note Patient still 6-7cm since 1430 yesterday, on pitocin the entire time. Fetus category II, IUPC in place. D/w mom and dad recommend c/s for arrest of dilation, which they are are agreeable to. D/c pitocin and prep OR. Ancef and azithromycin and may need prevena tood  Cornelia Copa MD Attending Center for [employer] (Faculty Practice) 11/07/2020 Time: [phone]

## 2020-11-08 ENCOUNTER — Inpatient Hospital Stay (HOSPITAL_COMMUNITY): Payer: BLUE CROSS/BLUE SHIELD

## 2020-11-08 ENCOUNTER — Encounter (HOSPITAL_COMMUNITY): Payer: Self-pay | Admitting: Obstetrics and Gynecology

## 2020-11-08 ENCOUNTER — Inpatient Hospital Stay (HOSPITAL_COMMUNITY)
Admission: AD | Admit: 2020-11-08 | Payer: BLUE CROSS/BLUE SHIELD | Source: Home / Self Care | Admitting: Obstetrics and Gynecology

## 2020-11-08 LAB — CBC
HCT: 27.3 % — ABNORMAL LOW (ref 36.0–46.0)
Hemoglobin: 9.2 g/dL — ABNORMAL LOW (ref 12.0–15.0)
MCH: 29.6 pg (ref 26.0–34.0)
MCHC: 33.7 g/dL (ref 30.0–36.0)
MCV: 87.8 fL (ref 80.0–100.0)
Platelets: 108 10*3/uL — ABNORMAL LOW (ref 150–400)
RBC: 3.11 MIL/uL — ABNORMAL LOW (ref 3.87–5.11)
RDW: 15 % (ref 11.5–15.5)
WBC: 10.1 10*3/uL (ref 4.0–10.5)
nRBC: 0 % (ref 0.0–0.2)

## 2020-11-08 NOTE — Lactation Note (Signed)
This note was copied from a baby's chart. Lactation Consultation Note  Patient Name: Boy Adalia Pettis [id number] Date: 11/08/2020 Reason for consult: Follow-up assessment;Mother's request;Primapara;1st time breastfeeding;NICU baby;Term  RN called for assist with baby's first time at the breast.   Baby 31 hrs old and has been bottle fed colostrum/formula  Mom has been consistently pumping and collecting small volumes of colostrum.  RN fed baby 5 ml colostrum prior to baby's first attempt at the breast.  Baby fussy and cueing vigorously.  Reclined Mom in chair and placed baby prone STS on Mom's chest.  Baby started bobbing his head around looking for the breast.  Assisted baby to find and attach to Mom's breast.  Lots of teaching on how to support and assist baby.  Baby latching deeply and sucking with occasional swallowing and then backing off breast to fuss.  Baby returned to rooting around and guided to breast repeatedly. Hand expressed drops of colostrum onto nipple to help direct baby to the breast.  He eventually calmed and relaxed his body, but the sucking ceased.    After baby was feeding on and off for 25 mins, baby fell asleep right next to the breast.  Mom to her room to pump and have lunch.  Mom encouraged to bring her pump parts to baby's room to pump at Tate's bedside.   Feeding Feeding Type: Breast Fed Nipple Type: Nfant Slow Flow (purple)  LATCH Score Latch: Repeated attempts needed to sustain latch, nipple held in mouth throughout feeding, stimulation needed to elicit sucking reflex.  Audible Swallowing: A few with stimulation  Type of Nipple: Everted at rest and after stimulation  Comfort (Breast/Nipple): Soft / non-tender  Hold (Positioning): Assistance needed to correctly position infant at breast and maintain latch.  LATCH Score: 7  Interventions Interventions: Breast feeding basics reviewed;Assisted with latch;Skin to skin;Breast massage;Hand express;Breast  compression;Adjust position;Support pillows;Position options;DEBP  Lactation Tools Discussed/Used Tools: Pump;Flanges Flange Size: 24 Breast pump type: Double-Electric Breast Pump   Consult Status Consult Status: Follow-up Date: 11/09/20 Follow-up type: In-patient    [name] 11/08/2020, 12:54 PM

## 2020-11-08 NOTE — Progress Notes (Signed)
Subjective: Postpartum Day 1: Cesarean Delivery d/t arrest of descent and dilatation  Patient reports no complaints this morning. Pain controlled, tolerating diet and voiding without problems.  Objective: Vital signs in last 24 hours: Temp:  [97.8 F (36.6 C)-99.8 F (37.7 C)] 97.9 F (36.6 C) (12/06 0523) Pulse Rate:  [65-99] 70 (12/06 0523) Resp:  [10-36] 18 (12/06 0523) BP: (92-128)/(50-82) 101/50 (12/06 0523) SpO2:  [95 %-100 %] 100 % (12/06 0523)  Physical Exam:  General: alert Lochia: appropriate Uterine Fundus: firm Incision: healing well DVT Evaluation: No evidence of DVT seen on physical exam.  Recent Labs    11/06/20 0314  HGB 13.3  HCT 40.1    Assessment/Plan: Status post Cesarean section. Doing well postoperatively.  Continue current care.  Hermina Staggers 11/08/2020, 7:24 AM

## 2020-11-08 NOTE — Lactation Note (Signed)
This note was copied from a baby's chart. Lactation Consultation Note  Patient Name: [name] [id number] Date: 11/08/2020 Reason for consult: Follow-up assessment;Primapara;1st time breastfeeding;NICU baby;Term  LC in to visit with P1 Mom and FOB in NICU.  Baby 29 hrs old and being bottle fed formula.  Mom holding her sleeping baby swaddled.   Mom aware of support offered by [facility] and suggested she inquire about STS and offering the breast.   Mom will ask her baby's nurse to call LC prn.  Mom states she is consistently pumping.     Interventions Interventions: Breast feeding basics reviewed;Skin to skin;Breast massage;Hand express;DEBP  Lactation Tools Discussed/Used Tools: Pump Breast pump type: Double-Electric Breast Pump   Consult Status Consult Status: Follow-up Date: 11/09/20 Follow-up type: In-patient    [name] 11/08/2020, 10:38 AM

## 2020-11-09 ENCOUNTER — Inpatient Hospital Stay (HOSPITAL_COMMUNITY): Payer: BLUE CROSS/BLUE SHIELD

## 2020-11-09 DIAGNOSIS — O9081 Anemia of the puerperium: Secondary | ICD-10-CM | POA: Diagnosis not present

## 2020-11-09 LAB — GLUCOSE, CAPILLARY: Glucose-Capillary: 119 mg/dL — ABNORMAL HIGH (ref 70–99)

## 2020-11-09 MED ORDER — SENNOSIDES-DOCUSATE SODIUM 8.6-50 MG PO TABS: 2.0000 | ORAL_TABLET | Freq: Every evening | ORAL | 2 refills | Status: AC | PRN

## 2020-11-09 MED ORDER — IBUPROFEN 800 MG PO TABS: 800.0000 mg | ORAL_TABLET | Freq: Three times a day (TID) | ORAL | 0 refills | Status: AC

## 2020-11-09 MED ORDER — OXYCODONE HCL 5 MG PO TABS
5.0000 mg | ORAL_TABLET | ORAL | 0 refills | Status: DC | PRN
Start: 2020-11-09 — End: 2020-11-15

## 2020-11-09 MED ORDER — FERROUS SULFATE 325 (65 FE) MG PO TABS: 325.0000 mg | ORAL_TABLET | ORAL | 1 refills | Status: DC

## 2020-11-09 NOTE — Lactation Note (Signed)
This note was copied from a baby's chart. Patient Name: Christine Barajas [id number] Date: 11/09/2020 Reason for consult: Follow-up assessment;NICU baby;Other (Comment) (RN request), breastfeeding assistance  Infant Data 40+ 6 GA 55 hours old at time of [facility] consult Admitted to NICU for respiratory distress and poor po feeding Genetic Studies pending  Maternal Data  g1p1 C-Section +breast changes during pregnancy Pumping q 3 hours with approximately per session Luna motif pump for at-home use    Lactation Tools Discussed/Used  24 mm nipple shield   Lactation Consultation Note LC to room at RN request to assist with bf. Baby actively cuing and struggling to latch. With mom's consent, LC provided a 32mm shield and made slight adjustments to infant's position. Baby Christine Barajas achieved latch momentarily  with a few audible swallows and +transitional milk visible in nipple shield. Increased work of breathe was observed and bf was discontinued.   LC encouraged mother to continue pumping to bring in and maintain milk supply until baby is able to bf. Encouraged sts and bf attempts when appropriate. Reviewed with parents that learning to bf is often a process that is supported by gavage and bottle feeding. Offered to return to assist prn. Parents were provided with the opportunity to ask questions. All concerns were addressed.  Will plan follow up visit.    Consult Status Consult Status: Follow-up Date: 11/10/20 Follow-up type: In-patient   Elder Negus, MA IBCLC 11/09/2020, 1:10 PM

## 2020-11-09 NOTE — Progress Notes (Signed)
Subjective: Postpartum Day 2: Cesarean Delivery Patient reports feeling well with some incisional soreness. She is ambulating, tolerating a regular diet, voiding  Objective: Vital signs in last 24 hours: Temp:  [97.6 F (36.4 C)-98.1 F (36.7 C)] 97.8 F (36.6 C) (12/07 0737) Pulse Rate:  [67-84] 67 (12/07 0737) Resp:  [16-18] 16 (12/07 0737) BP: (105-135)/(55-69) 105/55 (12/07 0737) SpO2:  [99 %-100 %] 100 % (12/07 0737)  Physical Exam:  General: alert, cooperative and no distress Lochia: appropriate Uterine Fundus: firm Incision: Prevena pump intact and functional DVT Evaluation: No evidence of DVT seen on physical exam.  Recent Labs    11/08/20 0536  HGB 9.2*  HCT 27.3*    Assessment/Plan: Status post Cesarean section. Doing well postoperatively.  Continue current care Plan for discharge home tomorrow.  Christine Barajas 11/09/2020, 9:09 AM

## 2020-11-09 NOTE — Clinical Social Work Maternal (Signed)
CLINICAL SOCIAL WORK MATERNAL/CHILD NOTE  Patient Details  Name: Christine Barajas MRN: [id number] Date of Birth: Sep 14, 1996  Date:  11/09/2020  Clinical Social Worker Initiating Note:  Laurey Arrow Date/Time: Initiated:  11/09/20/1247     Child's Name:  Christine Barajas   Biological Parents:  Mother, Father   Need for Interpreter:  None   Reason for Referral:  Parental Support of Premature Babies < 32 weeks/or Critically Ill babies   Address:  [address] 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   Homebound arranged:    Museum/gallery curator Resources:  Multimedia programmer    Other Resources:      Cultural/Religious Considerations Which May Impact Care:  None reported  Strengths:  Ability to meet basic needs  , Engineer, materials, Home prepared for child     Psychotropic Medications:         Pediatrician:    [place]  Pediatrician List:   [place] [facility] ([place]  [facility]      Pediatrician Fax Number:    Risk Factors/Current Problems:  None   Cognitive State:  Able to Concentrate  , Alert  , Linear Thinking  , Insightful     Mood/Affect:  Calm  , Interested  , Tearful  , Sad     CSW Assessment: CSW met with MOB and FOB at infant's bedside in room 325. When CSW arrived, infant was gone for a MRI.   MOB and FOB were present and were sitting on the couch together. CSW explained CSW's role and MOB gave CSW permission to speak with MOB while FOB was present. FOB appeared to be a support to MOB and he actively participated in the clinical assessment. MOB was appropriately teafruly during the assessment however was easy to engage and receptive to meeting with CSW.   CSW inquired about the couples thoughts and feeling regarding infant's NICU admission. MOB tearfully shared, "We were not expecting to have our baby to come to the NICU so everything has been overwhelming." FOB shared without prompting "We found out that our son eyes did not develop so he is going to be blind. We are still in shock that our son was born with a disability. CSW normalized and validated the couples thoughts and feelings.  They couple also shared not feeling well informed by the medical team.  Per FOB, the couple was not made aware that the infant "required a feeding tube and oxygen support.  We were never told this information; we had to come to visit him and  noticed all the changes."  MOB tearfully communicated, "I cannot take anymore surprises; it will be helpful if we are informed prior to visiting with infant."  CSW apologized for the team lack of communication and agreed to speak with leadership regarding the couples request (CSW spoke with Christine Barajas); the couple thanked CSW for acknowledging their concerns.   CSW provided education regarding the baby blues period vs. perinatal mood disorders, discussed treatment and gave resources for mental health follow up if concerns arise.  CSW recommends self-evaluation during the postpartum time period and encouraged MOB to contact a medical professional if symptoms are noted at any time.  MOB presented with insight and awareness and denied SI and HI when assessed for safety. The coupled reported having a good support team that consists of MOB's and FOB's immediate and extended family.    CSW discussed SSI in which baby qualifies for. The couple expressed interested in applying and CSW explained process and steps. The couple is aware that they can contact CSW for any questions or concerns arise regarding SSI.  CSW explained ongoing support services offered by NICU CSW and provided contact information.  The couple seemed very appreciative of the visit and thanked CSW.  CSW will continue to offer resources and supports to family while infant remains in NICU.    CSW Plan/Description:  Psychosocial Support and Ongoing Assessment of Needs, Perinatal Mood and Anxiety Disorder (PMADs) Education, Other Patient/Family Education, Other Information/Referral to [employer], [place] (SSI) Information   Laurey Arrow, MSW, LCSW Clinical Social Work [phone]   Dimple Nanas, LCSW 11/09/2020, 12:53 PM

## 2020-11-10 MED ORDER — POTASSIUM CHLORIDE CRYS ER 20 MEQ PO TBCR: 40.0000 meq | EXTENDED_RELEASE_TABLET | Freq: Every day | ORAL | 0 refills | Status: AC

## 2020-11-10 MED ORDER — FUROSEMIDE 40 MG PO TABS
20.0000 mg | ORAL_TABLET | Freq: Every day | ORAL | Status: DC
  Administered 2020-11-10: 20 mg via ORAL
  Filled 2020-11-10: qty 1

## 2020-11-10 MED ORDER — FUROSEMIDE 20 MG PO TABS: 20.0000 mg | ORAL_TABLET | Freq: Every day | ORAL | 0 refills | Status: AC

## 2020-11-10 NOTE — Lactation Note (Addendum)
This note was copied from a baby's chart. Lactation Consultation Note  Patient Name: Christine Barajas Date: 11/10/2020 Reason for consult: Follow-up assessment;NICU baby   P1, Baby 27 hours old.  Mother recently pumped 50 ml.  Praised her for her efforts. Discussed pumping at bedside with hospital grade pump once discharged and milk transportation. Mother has personal DEBP at home.  Mother's breasts are filling.  Discussed engorgement care. Parents going to NICU to bottle feed breastmilk and meet with MD. Mother knows to call call when latch assistance is desired.    Feeding Feeding Type: Breast Milk Nipple Type: Nfant Standard Flow (white)  Interventions Interventions: DEBP  LConsult Status Consult Status: Follow-up Date: 11/11/20 Follow-up type: In-patient   [name] [facility] 11/10/2020, 11:37 AM

## 2020-11-10 NOTE — Progress Notes (Signed)
Teaching complete  Home with prevena  Care  Discussed  And follow up care in office discussed written  Instruction given

## 2020-11-14 ENCOUNTER — Ambulatory Visit: Payer: Self-pay

## 2020-11-14 NOTE — Lactation Note (Signed)
This note was copied from a baby's chart. LC spoke with RN. Mom's milk is in and she is yielding supply sufficient for infant's needs. Parents are gone for the day but will return this evening to room-in. Infant will likely d/c this week. LC will plan f/u visit in the morning.    Elder Negus, MA IBCLC 11/14/2020, 10:42 AM

## 2020-11-15 ENCOUNTER — Ambulatory Visit (INDEPENDENT_AMBULATORY_CARE_PROVIDER_SITE_OTHER): Payer: BLUE CROSS/BLUE SHIELD | Admitting: Obstetrics & Gynecology

## 2020-11-15 ENCOUNTER — Other Ambulatory Visit: Payer: Self-pay

## 2020-11-15 ENCOUNTER — Ambulatory Visit: Payer: Self-pay

## 2020-11-15 ENCOUNTER — Encounter: Payer: Self-pay | Admitting: Obstetrics & Gynecology

## 2020-11-15 VITALS — BP 125/68 | HR 72

## 2020-11-15 DIAGNOSIS — Z98891 History of uterine scar from previous surgery: Secondary | ICD-10-CM

## 2020-11-15 MED ORDER — OXYCODONE HCL 5 MG PO TABS
5.0000 mg | ORAL_TABLET | ORAL | 0 refills | Status: AC | PRN
Start: 2020-11-15 — End: ?

## 2020-11-15 NOTE — Lactation Note (Signed)
This note was copied from a baby's chart. Lactation Consultation Note LC to room for f/u visit. Mother not present. Spoke with FOB. Mother is pumping q 3-4 hours with 3-4 oz yield per pumping. Noemi Chapel to d/c this week, per fob. Will plan f/u visit. FOB was provided with the opportunity to ask questions. All concerns were addressed.  Visit was brief. No charge.  Patient Name: Christine Barajas [id number] Date: 11/15/2020 Reason for consult: Follow-up assessment  Consult Status Consult Status: Follow-up Follow-up type: In-patient   Elder Negus, MA IBCLC 11/15/2020, 10:42 AM

## 2020-11-15 NOTE — Progress Notes (Signed)
°  HPI: Patient returns for routine postoperative follow-up having undergone primary Caesarean section  on 11/07/20.  The patient's immediate postoperative recovery has been unremarkable. Since hospital discharge the patient reports no problems.   Current Outpatient Medications: ferrous sulfate (FERROUSUL) 325 (65 FE) MG tablet, Take 1 tablet (325 mg total) by mouth every other day., Disp: 30 tablet, Rfl: 1 furosemide (LASIX) 20 MG tablet, Take 1 tablet (20 mg total) by mouth daily., Disp: 5 tablet, Rfl: 0 ibuprofen (ADVIL) 800 MG tablet, Take 1 tablet (800 mg total) by mouth every 8 (eight) hours., Disp: 30 tablet, Rfl: 0 potassium chloride SA (KLOR-CON) 20 MEQ tablet, Take 2 tablets (40 mEq total) by mouth daily for 5 days., Disp: 10 tablet, Rfl: 0 Prenatal Vit-DSS-Fe Cbn-FA (PRENATAL AD PO), Take 1 tablet by mouth daily. , Disp: , Rfl:  senna-docusate (SENOKOT-S) 8.6-50 MG tablet, Take 2 tablets by mouth at bedtime as needed for mild constipation., Disp: 30 tablet, Rfl: 2 oxyCODONE (OXY IR/ROXICODONE) 5 MG immediate release tablet, Take 1-2 tablets (5-10 mg total) by mouth every 4 (four) hours as needed for moderate pain. (Patient not taking: Reported on 11/15/2020), Disp: 30 tablet, Rfl: 0  No current facility-administered medications for this visit.    Blood pressure 125/68, pulse 72, currently breastfeeding.  Physical Exam: Incision clean dry intact Abdomen is benign  Diagnostic Tests:   Pathology:   Impression: No diagnosis found.   Plan: No orders of the defined types were placed in this encounter.    Follow up: No follow-ups on file.   Lazaro Arms, MD

## 2020-11-16 ENCOUNTER — Ambulatory Visit: Payer: Self-pay

## 2020-11-16 NOTE — Lactation Note (Signed)
This note was copied from a baby's chart. Lactation Consultation Note  Patient Name: Christine Barajas [id number] Date: 11/16/2020 Reason for consult: Follow-up assessment  P1 mother whose infant is now 19 days old.  This is a term baby at 40+6 weeks.  Family is planning to be discharged today.  Mother had a question related to gassiness in her newborn at 0400 this a.m.  Discussed possible reasons for the gas.  Mother continues to pump approximately 120 mls q 4 hours; no difficulty with pumping.  She has a DEBP for home use.  Mother has our OP phone number for any further questions/concerns.  Praised mother for her continued efforts with pumping.  Father present and supportive.  RN updated.   Maternal Data    Feeding Feeding Type: Breast Milk Nipple Type: Dr. Lorne Barajas  [facility] Score                   Interventions    Lactation Tools Discussed/Used     Consult Status Consult Status: Complete Date: 11/16/20 Follow-up type: Call as needed    Christine Barajas 11/16/2020, 8:16 AM

## 2020-12-08 ENCOUNTER — Encounter: Payer: Self-pay | Admitting: *Deleted

## 2020-12-09 ENCOUNTER — Encounter: Payer: Self-pay | Admitting: Advanced Practice Midwife

## 2020-12-09 ENCOUNTER — Other Ambulatory Visit: Payer: Self-pay

## 2020-12-09 ENCOUNTER — Ambulatory Visit (INDEPENDENT_AMBULATORY_CARE_PROVIDER_SITE_OTHER): Payer: BLUE CROSS/BLUE SHIELD | Admitting: Advanced Practice Midwife

## 2020-12-09 DIAGNOSIS — Z8279 Family history of other congenital malformations, deformations and chromosomal abnormalities: Secondary | ICD-10-CM

## 2020-12-09 MED ORDER — NORGESTIMATE-ETH ESTRADIOL 0.25-35 MG-MCG PO TABS: 1.0000 | ORAL_TABLET | Freq: Every day | ORAL | 11 refills | Status: DC

## 2020-12-09 NOTE — Progress Notes (Signed)
Post Partum Visit Note  Christine Barajas is a 25 y.o. G25P1001 female who presents for a postpartum visit. She is 4 weeks postpartum following a primary cesarean section.  I have fully reviewed the prenatal and intrapartum course. The delivery was at 40.6 gestational weeks. Had arrest of Dilation at 6-7 cms Anesthesia: epidural. Postpartum course has been uneventful. Baby is doing fairly well--he was born w/o eyes, found to have a mutation in one of his genes. In NICU for 2 weeks, now home on O2. Baby is feeding by bottle - Similac Alimentum. Bleeding staining only. Bowel function is getting better--some constipation issues, taking stool softners. Bladder function is normal. Patient is not sexually active. Contraception method is abstinence. Postpartum depression screening: negative. Has good family support.  Offered counseling, declined for now.    Review of Systems Pertinent items are noted in HPI.   Objective:  Blood pressure 136/80, height 5' (1.524 m), weight 217 lb (98.4 kg), not currently breastfeeding.  General:  alert, cooperative, and no distress   Breasts:  negative  Lungs: Normal respiratory effort  Heart:  regular rate and rhythm  Abdomen: soft, non-tender, incision well healed   Vulva:  normal  Vagina: normal vagina  Cervix:  normal  Corpus: Well involuted  Adnexa:  not evaluated  Rectal Exam: No external hemorrhoids        Assessment:    normal postpartum exam. Pap smear not done at today's visit.   Plan:   Essential components of care per ACOG recommendations:  1.  Mood and well being: Patient with negative depression screening today. Reviewed local resources for support.  - Patient does not use tobacco. If using tobacco we discussed reduction and for recently cessation risk of relapse - hx of drug use? No   If yes, discussed support systems  2. Infant care and feeding:  -Patient currently breastmilk feeding? No  Breast fed for a few weeks. If breastmilk feeding  discussed return to work and pumping. If needed, patient was provided letter for work to allow for every 2-3 hr pumping breaks, and to be granted a private location to express breastmilk and refrigerated area to store breastmilk. Reviewed importance of draining breast regularly to support lactation. -Social determinants of health (SDOH) reviewed in EPIC. No concernsThe following needs were identified none  3. Sexuality, contraception and birth spacing - Patient does not want a pregnancy in the next year.  Desired family size is 2 children.  - Reviewed forms of contraception in tiered fashion. Patient desired oral contraceptives (estrogen/progesterone) today.   - Discussed birth spacing of 18 months  4. Sleep and fatigue -Encouraged family/partner/community support of 4 hrs of uninterrupted sleep to help with mood and fatigue  5. Physical Recovery  - Discussed patients delivery and complications.  Discussed surprise findings of baby at birth, [place] not very helpful at dx eye issues.   - Patient has urinary incontinence? No Patient was referred to pelvic floor PT  - Patient is not safe to resume physical and sexual activity--give it a few more weeks d/t CS  6.  Health Maintenance - Last pap smear done 01/27/19 and was normal with negative HPV.   7. noChronic Disease - PCP follow up  Jacklyn Shell, CNM [facility], [facility]

## 2020-12-09 NOTE — Patient Instructions (Signed)

## 2020-12-13 ENCOUNTER — Ambulatory Visit: Payer: BLUE CROSS/BLUE SHIELD | Admitting: Obstetrics & Gynecology

## 2021-01-03 ENCOUNTER — Other Ambulatory Visit: Payer: Self-pay | Admitting: Obstetrics & Gynecology

## 2021-01-27 ENCOUNTER — Encounter (HOSPITAL_COMMUNITY): Payer: Self-pay | Admitting: Obstetrics and Gynecology

## 2021-04-10 ENCOUNTER — Other Ambulatory Visit: Payer: Self-pay | Admitting: Obstetrics & Gynecology

## 2021-08-21 ENCOUNTER — Emergency Department (HOSPITAL_BASED_OUTPATIENT_CLINIC_OR_DEPARTMENT_OTHER): Payer: Commercial Managed Care - PPO

## 2021-08-21 ENCOUNTER — Other Ambulatory Visit: Payer: Self-pay

## 2021-08-21 ENCOUNTER — Emergency Department (HOSPITAL_BASED_OUTPATIENT_CLINIC_OR_DEPARTMENT_OTHER)
Admission: EM | Admit: 2021-08-21 | Discharge: 2021-08-21 | Disposition: A | Discharge status: Home / Self Care | Payer: Commercial Managed Care - PPO | Attending: Emergency Medicine | Admitting: Emergency Medicine

## 2021-08-21 ENCOUNTER — Encounter (HOSPITAL_BASED_OUTPATIENT_CLINIC_OR_DEPARTMENT_OTHER): Payer: Self-pay

## 2021-08-21 DIAGNOSIS — R197 Diarrhea, unspecified: Secondary | ICD-10-CM | POA: Insufficient documentation

## 2021-08-21 DIAGNOSIS — R1110 Vomiting, unspecified: Secondary | ICD-10-CM | POA: Diagnosis not present

## 2021-08-21 DIAGNOSIS — R1084 Generalized abdominal pain: Secondary | ICD-10-CM

## 2021-08-21 DIAGNOSIS — R1012 Left upper quadrant pain: Secondary | ICD-10-CM | POA: Diagnosis present

## 2021-08-21 LAB — COMPREHENSIVE METABOLIC PANEL
ALT: 12 U/L (ref 0–44)
AST: 15 U/L (ref 15–41)
Albumin: 4.3 g/dL (ref 3.5–5.0)
Alkaline Phosphatase: 43 U/L (ref 38–126)
Anion gap: 13 (ref 5–15)
BUN: 12 mg/dL (ref 6–20)
CO2: 22 mmol/L (ref 22–32)
Calcium: 9.1 mg/dL (ref 8.9–10.3)
Chloride: 105 mmol/L (ref 98–111)
Creatinine, Ser: 0.7 mg/dL (ref 0.44–1.00)
GFR, Estimated: >60 mL/min (ref >60)
Glucose, Bld: 123 mg/dL — ABNORMAL HIGH (ref 70–99)
Potassium: 4.3 mmol/L (ref 3.5–5.1)
Sodium: 140 mmol/L (ref 135–145)
Total Bilirubin: 0.6 mg/dL (ref 0.3–1.2)
Total Protein: 7.5 g/dL (ref 6.5–8.1)

## 2021-08-21 LAB — URINALYSIS, ROUTINE W REFLEX MICROSCOPIC
Glucose, UA: NEGATIVE mg/dL
Hgb urine dipstick: NEGATIVE
Ketones, ur: 15 mg/dL — AB
Leukocytes,Ua: NEGATIVE
Nitrite: NEGATIVE
Specific Gravity, Urine: >1.03 — ABNORMAL HIGH (ref 1.005–1.030)
pH: 5.5 (ref 5.0–8.0)

## 2021-08-21 LAB — CBC
HCT: 43.8 % (ref 36.0–46.0)
Hemoglobin: 14.3 g/dL (ref 12.0–15.0)
MCH: 27.6 pg (ref 26.0–34.0)
MCHC: 32.6 g/dL (ref 30.0–36.0)
MCV: 84.4 fL (ref 80.0–100.0)
Platelets: 225 10*3/uL (ref 150–400)
RBC: 5.19 MIL/uL — ABNORMAL HIGH (ref 3.87–5.11)
RDW: 13.2 % (ref 11.5–15.5)
WBC: 14.9 10*3/uL — ABNORMAL HIGH (ref 4.0–10.5)
nRBC: 0 % (ref 0.0–0.2)

## 2021-08-21 LAB — LIPASE, BLOOD: Lipase: 23 U/L (ref 11–51)

## 2021-08-21 LAB — URINALYSIS, MICROSCOPIC (REFLEX): Bacteria, UA: NONE SEEN

## 2021-08-21 LAB — PREGNANCY, URINE: Preg Test, Ur: NEGATIVE

## 2021-08-21 MED ORDER — OMEPRAZOLE 20 MG PO CPDR: 20.0000 mg | DELAYED_RELEASE_CAPSULE | Freq: Every day | ORAL | 0 refills | Status: AC

## 2021-08-21 MED ORDER — SODIUM CHLORIDE 0.9 % IV BOLUS
1000.0000 mL | Freq: Once | INTRAVENOUS | Status: AC
  Administered 2021-08-21: 1000 mL via INTRAVENOUS

## 2021-08-21 MED ORDER — ONDANSETRON HCL 4 MG/2ML IJ SOLN
4.0000 mg | Freq: Once | INTRAMUSCULAR | Status: AC
  Administered 2021-08-21: 4 mg via INTRAVENOUS
  Filled 2021-08-21: qty 2

## 2021-08-21 MED ORDER — ONDANSETRON HCL 4 MG/2ML IJ SOLN
4.0000 mg | Freq: Once | INTRAMUSCULAR | Status: AC
  Administered 2021-08-21: 4 mg via INTRAVENOUS

## 2021-08-21 MED ORDER — ONDANSETRON 4 MG PO TBDP: 4.0000 mg | ORAL_TABLET | Freq: Three times a day (TID) | ORAL | 0 refills | Status: AC | PRN

## 2021-08-21 MED ORDER — IOHEXOL 350 MG/ML SOLN
75.0000 mL | Freq: Once | INTRAVENOUS | Status: AC | PRN
  Administered 2021-08-21: 75 mL via INTRAVENOUS

## 2021-08-21 NOTE — ED Notes (Signed)
Patient transported to CT 

## 2021-08-21 NOTE — ED Provider Notes (Signed)
[facility] EMERGENCY DEPT Provider Note   CSN: [id number] Arrival date & time: 08/21/21  2053     History Chief Complaint  Patient presents with   Abdominal Pain   Emesis    Christine Barajas is a 25 y.o. female.  Patient presents to ER chief complaint of abdominal pain.  Symptoms ongoing for the past 4 to 5 hours.  Describes a sharp and aching pain in the left upper quadrant.  States it was worse earlier today but currently improving.  Also associated vomiting and diarrhea today multiple times nonbloody nonbilious.  She ate out and had chicken tenders around 2 PM, symptoms started around 5 PM.  Denies fevers or cough denies chest pain.      Past Medical History:  Diagnosis Date   Family history of adverse reaction to anesthesia    father trouble waking up   Medical history non-contributory     Patient Active Problem List   Diagnosis Date Noted   Family history of birth defect 12/09/2020   Postpartum anemia 11/09/2020   Macrosomia of fetus affecting management of mother in third trimester 11/07/2020   Cesarean delivery delivered 11/07/2020   Supervision of normal first pregnancy 04/19/2020    Past Surgical History:  Procedure Laterality Date   CESAREAN SECTION N/A 11/07/2020   Procedure: CESAREAN SECTION;  Surgeon: Fishhook Bing, MD;  Location: [facility] LD ORS;  Service: Obstetrics;  Laterality: N/A;   WISDOM TOOTH EXTRACTION       OB History     Gravida  1   Para  1   Term  1   Preterm  0   AB  0   Living  1      SAB  0   IAB  0   Ectopic  0   Multiple  0   Live Births  1           Family History  Problem Relation Age of Onset   Heart attack Paternal Grandfather    Breast cancer Paternal Grandmother     Social History   Tobacco Use   Smoking status: Never   Smokeless tobacco: Never  Vaping Use   Vaping Use: Never used  Substance Use Topics   Alcohol use: Not Currently    Comment: not often   Drug use: Never     Home Medications Prior to Admission medications   Medication Sig Start Date End Date Taking? Authorizing Provider  omeprazole (PRILOSEC) 20 MG capsule Take 1 capsule (20 mg total) by mouth daily for 20 days. 08/21/21 09/10/21 Yes Cheryll Cockayne, MD  ondansetron (ZOFRAN ODT) 4 MG disintegrating tablet Take 1 tablet (4 mg total) by mouth every 8 (eight) hours as needed for nausea or vomiting. 08/21/21  Yes Cheryll Cockayne, MD  ferrous sulfate 325 (65 FE) MG tablet TAKE 1 TABLET BY MOUTH EVERY OTHER DAY 04/12/21   Anyanwu, Jethro Bastos, MD  furosemide (LASIX) 20 MG tablet Take 1 tablet (20 mg total) by mouth daily. Patient not taking: Reported on 12/09/2020 11/10/20   Tereso Newcomer, MD  ibuprofen (ADVIL) 800 MG tablet Take 1 tablet (800 mg total) by mouth every 8 (eight) hours. Patient not taking: Reported on 12/09/2020 11/10/20   Tereso Newcomer, MD  norgestimate-ethinyl estradiol (ORTHO-CYCLEN) 0.25-35 MG-MCG tablet Take 1 tablet by mouth daily. 12/09/20   Cresenzo-Dishmon, Scarlette Calico, CNM  oxyCODONE (OXY IR/ROXICODONE) 5 MG immediate release tablet Take 1-2 tablets (5-10 mg total) by mouth every 4 (four)  hours as needed for moderate pain. Patient not taking: Reported on 12/09/2020 11/15/20   Lazaro Arms, MD  potassium chloride SA (KLOR-CON) 20 MEQ tablet Take 2 tablets (40 mEq total) by mouth daily for 5 days. 11/10/20 11/15/20  Anyanwu, Jethro Bastos, MD  Prenatal Vit-DSS-Fe Cbn-FA (PRENATAL AD PO) Take 1 tablet by mouth daily.  Patient not taking: Reported on 12/09/2020    [provider]  senna-docusate (SENOKOT-S) 8.6-50 MG tablet Take 2 tablets by mouth at bedtime as needed for mild constipation. Patient not taking: Reported on 12/09/2020 11/09/20   Tereso Newcomer, MD    Allergies    Patient has no known allergies.  Review of Systems   Review of Systems  Constitutional:  Negative for fever.  HENT:  Negative for ear pain.   Eyes:  Negative for pain.  Respiratory:  Negative for cough.    Cardiovascular:  Negative for chest pain.  Gastrointestinal:  Positive for abdominal pain.  Genitourinary:  Negative for flank pain.  Musculoskeletal:  Negative for back pain.  Skin:  Negative for rash.  Neurological:  Negative for headaches.   Physical Exam Updated Vital Signs BP 121/67 (BP Location: Left Arm)   Pulse (!) 104   Temp 98.1 F (36.7 C) (Oral)   Resp 16   Ht 5' (1.524 m)   Wt 102.1 kg   LMP 06/03/2021   SpO2 100%   BMI 43.94 kg/m   Physical Exam Constitutional:      General: She is not in acute distress.    Appearance: Normal appearance.  HENT:     Head: Normocephalic.     Nose: Nose normal.  Eyes:     Extraocular Movements: Extraocular movements intact.  Cardiovascular:     Rate and Rhythm: Normal rate.  Pulmonary:     Effort: Pulmonary effort is normal.  Abdominal:     Comments: Mild to moderate tenderness in the left upper quadrant.  No guarding or rebound noted.  Normal bowel sounds.  Musculoskeletal:        General: Normal range of motion.     Cervical back: Normal range of motion.  Neurological:     General: No focal deficit present.     Mental Status: She is alert. Mental status is at baseline.    ED Results / Procedures / Treatments   Labs (all labs ordered are listed, but only abnormal results are displayed) Labs Reviewed  COMPREHENSIVE METABOLIC PANEL - Abnormal; Notable for the following components:      Result Value   Glucose, Bld 123 (*)    All other components within normal limits  CBC - Abnormal; Notable for the following components:   WBC 14.9 (*)    RBC 5.19 (*)    All other components within normal limits  URINALYSIS, ROUTINE W REFLEX MICROSCOPIC - Abnormal; Notable for the following components:   Color, Urine BROWN (*)    APPearance CLOUDY (*)    Specific Gravity, Urine >1.030 (*)    Bilirubin Urine SMALL (*)    Ketones, ur 15 (*)    Protein, ur TRACE (*)    All other components within normal limits  LIPASE, BLOOD   PREGNANCY, URINE  URINALYSIS, MICROSCOPIC (REFLEX)    EKG None  Radiology CT Abdomen Pelvis W Contrast  Result Date: 08/21/2021 CLINICAL DATA:  Nonlocalized acute abdominal pain. Pt reports persistent vomiting and epigastric pain - onset 3 hrs ago - states has hx of acid reflux. EXAM: CT ABDOMEN AND PELVIS  WITH CONTRAST TECHNIQUE: Multidetector CT imaging of the abdomen and pelvis was performed using the standard protocol following bolus administration of intravenous contrast. CONTRAST:  72mL OMNIPAQUE IOHEXOL 350 MG/ML SOLN COMPARISON:  None. FINDINGS: Lower chest: No acute abnormality. Hepatobiliary: No focal liver abnormality. No gallstones, gallbladder wall thickening, or pericholecystic fluid. No biliary dilatation. Pancreas: No focal lesion. Normal pancreatic contour. No surrounding inflammatory changes. No main pancreatic ductal dilatation. Spleen: Normal in size without focal abnormality. Adrenals/Urinary Tract: No adrenal nodule bilaterally. Bilateral kidneys enhance symmetrically. No hydronephrosis. No hydroureter. The urinary bladder is unremarkable. Stomach/Bowel: Stomach is within normal limits. No evidence of bowel wall thickening or dilatation. Appendix appears normal. Vascular/Lymphatic: No abdominal aorta or iliac aneurysm. No abdominal, pelvic, or inguinal lymphadenopathy. Reproductive: Uterus and bilateral adnexa are unremarkable. Other: No intraperitoneal free fluid. No intraperitoneal free gas. No organized fluid collection. Musculoskeletal: No abdominal wall hernia or abnormality. Couple subcentimeter soft tissue densities within the anterior abdominal wall likely related to medication injection. No suspicious lytic or blastic osseous lesions. No acute displaced fracture. IMPRESSION: No acute intra-abdominal or intrapelvic abnormality. Electronically Signed   By: Tish Frederickson M.D.   On: 08/21/2021 23:17    Procedures Procedures   Medications Ordered in ED Medications   ondansetron (ZOFRAN) injection 4 mg (has no administration in time range)  ondansetron (ZOFRAN) injection 4 mg (4 mg Intravenous Given 08/21/21 2129)  sodium chloride 0.9 % bolus 1,000 mL (1,000 mLs Intravenous New Bag/Given 08/21/21 2153)  iohexol (OMNIPAQUE) 350 MG/ML injection 75 mL (75 mLs Intravenous Contrast Given 08/21/21 2255)    ED Course  I have reviewed the triage vital signs and the nursing notes.  Pertinent labs & imaging results that were available during my care of the patient were reviewed by me and considered in my medical decision making (see chart for details).    MDM Rules/Calculators/A&P                           Labs show white count of 14.  Urinalysis unremarkable.  CT of the pelvis pursued finding no acute findings.  Patient given a liter bolus of fluids and Zofran with improvement of symptoms.  Will recommend outpatient follow-up with primary care doctor within the week, recommending immediate return for worsening symptoms or any additional concerns.    Final Clinical Impression(s) / ED Diagnoses Final diagnoses:  Generalized abdominal pain  Vomiting and diarrhea    Rx / DC Orders ED Discharge Orders          Ordered    omeprazole (PRILOSEC) 20 MG capsule  Daily        08/21/21 2324    ondansetron (ZOFRAN ODT) 4 MG disintegrating tablet  Every 8 hours PRN        08/21/21 2324             Cheryll Cockayne, MD 08/21/21 2324

## 2021-08-21 NOTE — Discharge Instructions (Signed)
Call your primary care doctor or specialist as discussed in the next 2-3 days.   Return immediately back to the ER if:  Your symptoms worsen within the next 12-24 hours. You develop new symptoms such as new fevers, persistent vomiting, new pain, shortness of breath, or new weakness or numbness, or if you have any other concerns.  

## 2021-08-21 NOTE — ED Triage Notes (Signed)
Pt reports persistent vomiting and epigastric pain - onset 3 hrs ago -  states has hx of acid reflux.

## 2021-09-20 ENCOUNTER — Other Ambulatory Visit: Payer: Self-pay | Admitting: Advanced Practice Midwife

## 2022-08-04 ENCOUNTER — Other Ambulatory Visit: Payer: Self-pay | Admitting: Advanced Practice Midwife

## 2023-08-21 ENCOUNTER — Other Ambulatory Visit: Payer: Self-pay | Admitting: Advanced Practice Midwife
